# Patient Record
Sex: Male | Born: 1970 | Race: Black or African American | Hispanic: No | Marital: Married | Smoking: Never smoker
Health system: Southern US, Community
[De-identification: ages and names within clinical notes are randomized; demographics above are authoritative.]

## PROBLEM LIST (undated history)

## (undated) DIAGNOSIS — J45909 Unspecified asthma, uncomplicated: Secondary | ICD-10-CM

## (undated) DIAGNOSIS — I10 Essential (primary) hypertension: Secondary | ICD-10-CM

## (undated) DIAGNOSIS — Z8711 Personal history of peptic ulcer disease: Secondary | ICD-10-CM

## (undated) HISTORY — DX: Personal history of peptic ulcer disease: Z87.11

## (undated) HISTORY — DX: Unspecified asthma, uncomplicated: J45.909

## (undated) HISTORY — PX: HERNIA REPAIR: SHX51

---

## 2010-03-19 ENCOUNTER — Encounter: Admission: RE | Admit: 2010-03-19 | Discharge: 2010-03-19 | Payer: Self-pay | Admitting: Family Medicine

## 2010-04-18 ENCOUNTER — Ambulatory Visit (HOSPITAL_BASED_OUTPATIENT_CLINIC_OR_DEPARTMENT_OTHER): Admission: RE | Admit: 2010-04-18 | Discharge: 2010-04-18 | Payer: Self-pay | Admitting: Surgery

## 2012-05-20 HISTORY — PX: INGUINAL HERNIA REPAIR: SUR1180

## 2014-01-10 ENCOUNTER — Encounter (HOSPITAL_COMMUNITY): Payer: Self-pay | Admitting: Emergency Medicine

## 2014-01-10 ENCOUNTER — Emergency Department (HOSPITAL_COMMUNITY)
Admission: EM | Admit: 2014-01-10 | Discharge: 2014-01-10 | Disposition: A | Discharge status: Home / Self Care | Payer: Self-pay | Attending: Emergency Medicine | Admitting: Emergency Medicine

## 2014-01-10 DIAGNOSIS — L519 Erythema multiforme, unspecified: Secondary | ICD-10-CM | POA: Insufficient documentation

## 2014-01-10 DIAGNOSIS — L509 Urticaria, unspecified: Secondary | ICD-10-CM | POA: Insufficient documentation

## 2014-01-10 DIAGNOSIS — IMO0002 Reserved for concepts with insufficient information to code with codable children: Secondary | ICD-10-CM | POA: Insufficient documentation

## 2014-01-10 MED ORDER — HYDROXYZINE HCL 25 MG PO TABS: 25.0000 mg | ORAL_TABLET | Freq: Four times a day (QID) | ORAL | Status: DC

## 2014-01-10 MED ORDER — CIMETIDINE 400 MG PO TABS: 400.0000 mg | ORAL_TABLET | Freq: Two times a day (BID) | ORAL | Status: DC

## 2014-01-10 MED ORDER — PREDNISONE 20 MG PO TABS: ORAL_TABLET | ORAL | Status: DC

## 2014-01-10 MED ORDER — METHYLPREDNISOLONE ACETATE 80 MG/ML IJ SUSP
120.0000 mg | Freq: Once | INTRAMUSCULAR | Status: AC
  Administered 2014-01-10: 120 mg via INTRAMUSCULAR
  Filled 2014-01-10 (×2): qty 2

## 2014-01-10 NOTE — ED Provider Notes (Signed)
  Chief Complaint    Chief Complaint  Patient presents with  . Urticaria    History of Present Illness      Charles Dickson is a 43 year old male with a 2 day history of an itchy rash on his arms, legs, and posterior neck. These lesions are erythematous, maculopapular, and itch and hurt. He denies any rash in the trunk. There have been no new foods or medications. He denies any bites or stings. He did sleep on the couch with a friend who thinks he may have had bedbugs infesting his house. The friend's daughter have the same thing. He denies any fever or chills. There been no sore throat, headache, stiff neck, or URI symptoms. He has not been exposed to any allergens or antigens. He denies any difficulty breathing or wheezing. There is been no swelling of lips, tongue, or throat.  Review of Systems   Other than as noted above, the patient denies any of the following symptoms: Systemic:  No fever or chills. ENT:  No nasal congestion, rhinorrhea, sore throat, swelling of lips, tongue or throat. Resp:  No cough, wheezing, or shortness of breath.  [place]    Past medical history, family history, social history, meds, and allergies were reviewed.   Physical Exam     Vital signs:  BP 153/97  Pulse 103  Temp(Src) 97.7 F (36.5 C) (Oral)  Resp 14  Ht 5\' 9"  (1.753 m)  Wt 250 lb (113.399 kg)  BMI 36.90 kg/m2  SpO2 98% Gen:  Alert, oriented, in no distress. ENT:  Pharynx clear, no intraoral lesions, moist mucous membranes. Lungs:  Clear to auscultation. Skin:  He has multiple erythematous maculopapular lesions on his neck, back, arms, and legs, and some on the hands, not on the palm to the hands of souls. There were no lesions of the mucous membranes.  Course in [facility] [place]     The following meds were given:  Medications  methylPREDNISolone acetate (DEPO-MEDROL) injection 120 mg (120 mg Intramuscular Given 01/10/14 0846)   Assessment    The encounter diagnosis was Erythema  multiforme.  Plan     1.  Meds:  The following meds were prescribed:   Discharge Medication List as of 01/10/2014  8:43 AM    START taking these medications   Details  cimetidine (TAGAMET) 400 MG tablet Take 1 tablet (400 mg total) by mouth 2 (two) times daily., Starting 01/10/2014, Until Discontinued, Print    hydrOXYzine (ATARAX/VISTARIL) 25 MG tablet Take 1 tablet (25 mg total) by mouth every 6 (six) hours., Starting 01/10/2014, Until Discontinued, Print    predniSONE (DELTASONE) 20 MG tablet Take 3 daily for 5 days, 2 daily for 5 days, 1 daily for 5 days., Print        2.  Patient Education/Counseling:  The patient was given appropriate handouts, self care instructions, and instructed in symptomatic relief.   3.  Follow up:  The patient was told to follow up here if no better in 3 to 4 days, or sooner if becoming worse in any way, and given some red flag symptoms such as worsening rash, fever, or difficulty breathing which would prompt immediate return.  Follow up here if necessary.      Harden Mo, MD 01/10/14 [phone]

## 2014-01-10 NOTE — ED Notes (Signed)
Patient states "stayed at my sister's house in [place] last night.  Her daughter had bed bugs".

## 2014-01-10 NOTE — Discharge Instructions (Signed)
Hives Hives are itchy, red, swollen areas of the skin. They can vary in size and location on your body. Hives can come and go for hours or several days (acute hives) or for several weeks (chronic hives). Hives do not spread from person to person (noncontagious). They may get worse with scratching, exercise, and emotional stress. CAUSES   Allergic reaction to food, additives, or drugs.  Infections, including the common cold.  Illness, such as vasculitis, lupus, or thyroid disease.  Exposure to sunlight, heat, or cold.  Exercise.  Stress.  Contact with chemicals. SYMPTOMS   Red or white swollen patches on the skin. The patches may change size, shape, and location quickly and repeatedly.  Itching.  Swelling of the hands, feet, and face. This may occur if hives develop deeper in the skin. DIAGNOSIS  Your caregiver can usually tell what is wrong by performing a physical exam. Skin or blood tests may also be done to determine the cause of your hives. In some cases, the cause cannot be determined. TREATMENT  Mild cases usually get better with medicines such as antihistamines. Severe cases may require an emergency epinephrine injection. If the cause of your hives is known, treatment includes avoiding that trigger.  HOME CARE INSTRUCTIONS   Avoid causes that trigger your hives.  Take antihistamines as directed by your caregiver to reduce the severity of your hives. Non-sedating or low-sedating antihistamines are usually recommended. Do not drive while taking an antihistamine.  Take any other medicines prescribed for itching as directed by your caregiver.  Wear loose-fitting clothing.  Keep all follow-up appointments as directed by your caregiver. SEEK MEDICAL CARE IF:   You have persistent or severe itching that is not relieved with medicine.  You have painful or swollen joints. SEEK IMMEDIATE MEDICAL CARE IF:   You have a fever.  Your tongue or lips are swollen.  You have  trouble breathing or swallowing.  You feel tightness in the throat or chest.  You have abdominal pain. These problems may be the first sign of a life-threatening allergic reaction. Call your local emergency services (911 in [place] MAKE SURE YOU:   Understand these instructions.  Will watch your condition.  Will get help right away if you are not doing well or get worse. Document Released: 05/06/2005 Document Revised: 05/11/2013 Document Reviewed: 07/30/2011 ExitCare Patient Information 2015 [employer]. This information is not intended to replace advice given to you by your health care provider. Make sure you discuss any questions you have with your health care provider.  

## 2014-01-10 NOTE — ED Notes (Signed)
Pt reports hives to both arms and legs, back of neck since last night. They itch. Denies respiratory concerns. Is a x 4

## 2014-08-04 ENCOUNTER — Other Ambulatory Visit: Payer: Self-pay | Admitting: Surgery

## 2014-08-04 DIAGNOSIS — R1084 Generalized abdominal pain: Secondary | ICD-10-CM

## 2014-08-08 ENCOUNTER — Other Ambulatory Visit: Payer: Self-pay | Admitting: General Surgery

## 2014-08-10 ENCOUNTER — Other Ambulatory Visit: Payer: Self-pay | Admitting: General Surgery

## 2014-08-10 DIAGNOSIS — T8140XA Infection following a procedure, unspecified, initial encounter: Secondary | ICD-10-CM

## 2014-08-10 DIAGNOSIS — K9189 Other postprocedural complications and disorders of digestive system: Secondary | ICD-10-CM

## 2014-08-10 DIAGNOSIS — R1084 Generalized abdominal pain: Secondary | ICD-10-CM

## 2015-06-16 ENCOUNTER — Emergency Department (HOSPITAL_COMMUNITY): Payer: Managed Care, Other (non HMO)

## 2015-06-16 ENCOUNTER — Observation Stay (HOSPITAL_COMMUNITY)
Admission: EM | Admit: 2015-06-16 | Discharge: 2015-06-17 | Disposition: A | Discharge status: Home / Self Care | Payer: Managed Care, Other (non HMO) | Attending: Internal Medicine | Admitting: Internal Medicine

## 2015-06-16 ENCOUNTER — Encounter (HOSPITAL_COMMUNITY): Payer: Self-pay | Admitting: Emergency Medicine

## 2015-06-16 DIAGNOSIS — E669 Obesity, unspecified: Secondary | ICD-10-CM | POA: Diagnosis not present

## 2015-06-16 DIAGNOSIS — R0789 Other chest pain: Secondary | ICD-10-CM

## 2015-06-16 DIAGNOSIS — R079 Chest pain, unspecified: Secondary | ICD-10-CM | POA: Diagnosis present

## 2015-06-16 DIAGNOSIS — R002 Palpitations: Secondary | ICD-10-CM | POA: Diagnosis not present

## 2015-06-16 DIAGNOSIS — I10 Essential (primary) hypertension: Secondary | ICD-10-CM | POA: Insufficient documentation

## 2015-06-16 DIAGNOSIS — R9431 Abnormal electrocardiogram [ECG] [EKG]: Secondary | ICD-10-CM | POA: Insufficient documentation

## 2015-06-16 DIAGNOSIS — Z6838 Body mass index (BMI) 38.0-38.9, adult: Secondary | ICD-10-CM | POA: Diagnosis not present

## 2015-06-16 HISTORY — DX: Essential (primary) hypertension: I10

## 2015-06-16 MED ORDER — ASPIRIN 81 MG PO CHEW
324.0000 mg | CHEWABLE_TABLET | Freq: Once | ORAL | Status: AC
  Administered 2015-06-17: 324 mg via ORAL
  Filled 2015-06-16: qty 4

## 2015-06-16 NOTE — ED Provider Notes (Signed)
CSN: [id number]     Arrival date & time 06/16/15  2331 History   First MD Initiated Contact with Patient 06/16/15 2341     Chief Complaint  Patient presents with  . Chest Pain     (Consider location/radiation/quality/duration/timing/severity/associated sxs/prior Treatment) HPI Comments: While at work developed chest tightness that radiated to L arm lasted 45 minutes resolved with time. Dose report nausea and SOB with pain Now pain free Never has had these symptoms before, No medical history does not take nay medication  Former alcohol use never smoker  Patient is a 45 y.o. male presenting with chest pain. The history is provided by the patient.  Chest Pain Pain location:  Substernal area and L chest Pain quality: tightness   Pain radiates to:  L arm Pain radiates to the back: no   Pain severity:  Moderate Onset quality:  Sudden Duration:  45 minutes Progression:  Resolved Chronicity:  New Relieved by:  Rest Worsened by:  Nothing tried Ineffective treatments:  None tried Associated symptoms: nausea, numbness and shortness of breath   Associated symptoms: no dizziness, no fever, no headache and no palpitations   Nausea:    Severity:  Mild   Onset quality:  Sudden Shortness of breath:    Severity:  Mild   Onset quality:  Sudden   Progression:  Resolved Risk factors: male sex and obesity   Risk factors: no coronary artery disease, no diabetes mellitus, no high cholesterol, no hypertension and no smoking     Past Medical History  Diagnosis Date  . Hypertension    Past Surgical History  Procedure Laterality Date  . Hernia repair     No family history on file. Social History  Substance Use Topics  . Smoking status: Never Smoker   . Smokeless tobacco: None  . Alcohol Use: No     Comment: former    Review of Systems  Constitutional: Negative for fever and chills.  Respiratory: Positive for shortness of breath. Negative for choking.   Cardiovascular: Positive for  chest pain. Negative for palpitations and leg swelling.  Gastrointestinal: Positive for nausea.  Skin: Negative for rash.  Neurological: Positive for numbness. Negative for dizziness and headaches.  All other systems reviewed and are negative.     Allergies  Review of patient's allergies indicates no known allergies.  Home Medications   Prior to Admission medications   Medication Sig Start Date End Date Taking? Authorizing Provider  cimetidine (TAGAMET) 400 MG tablet Take 1 tablet (400 mg total) by mouth 2 (two) times daily. Patient not taking: Reported on 06/17/2015 01/10/14   Harden Mo, MD  hydrOXYzine (ATARAX/VISTARIL) 25 MG tablet Take 1 tablet (25 mg total) by mouth every 6 (six) hours. Patient not taking: Reported on 06/17/2015 01/10/14   Harden Mo, MD  predniSONE (DELTASONE) 20 MG tablet Take 3 daily for 5 days, 2 daily for 5 days, 1 daily for 5 days. Patient not taking: Reported on 06/17/2015 01/10/14   Harden Mo, MD   BP 111/76 mmHg  Pulse 66  Temp(Src) 97.8 F (36.6 C) (Oral)  Resp 16  Ht 5\' 9"  (1.753 m)  Wt 117.935 kg  BMI 38.38 kg/m2  SpO2 100% Physical Exam  Constitutional: He is oriented to person, place, and time. He appears well-developed and well-nourished. No distress.  HENT:  Head: Normocephalic.  Eyes: Pupils are equal, round, and reactive to light.  Neck: Normal range of motion.  Cardiovascular: Normal rate and regular rhythm.  Pulmonary/Chest: Effort normal and breath sounds normal.  Musculoskeletal: Normal range of motion.  Neurological: He is alert and oriented to person, place, and time.  Skin: Skin is warm and dry. He is not diaphoretic.  Nursing note and vitals reviewed.   ED Course  Procedures (including critical care time) Labs Review Labs Reviewed  CBC WITH DIFFERENTIAL/PLATELET - Abnormal; Notable for the following:    MCV 76.0 (*)    All other components within normal limits  I-STAT CHEM 8, ED - Abnormal; Notable for the  following:    Glucose, Bld 100 (*)    All other components within normal limits  TROPONIN I  TROPONIN I  TROPONIN I  I-STAT TROPOININ, ED    Imaging Review Dg Chest 2 View  06/17/2015  CLINICAL DATA:  Acute onset of left-sided chest pain. Initial encounter. EXAM: CHEST  2 VIEW COMPARISON:  None. FINDINGS: The lungs are well-aerated. Pulmonary vascularity is at the upper limits of normal. There is no evidence of focal opacification, pleural effusion or pneumothorax. The heart is normal in size; the mediastinal contour is within normal limits. No acute osseous abnormalities are seen. IMPRESSION: No acute cardiopulmonary process seen. Electronically Signed   By: Garald Balding M.D.   On: 06/17/2015 00:46   I have personally reviewed and evaluated these images and lab results as part of my medical decision-making.   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

## 2015-06-16 NOTE — ED Notes (Signed)
Pt states about an hour ago he was at work and started having pain in his left chest  Pt states the pain radiated down the left arm  Pt states with the pain he had some sweating and shortness of breath

## 2015-06-17 ENCOUNTER — Other Ambulatory Visit: Payer: Self-pay | Admitting: Internal Medicine

## 2015-06-17 ENCOUNTER — Emergency Department (HOSPITAL_COMMUNITY): Payer: Managed Care, Other (non HMO)

## 2015-06-17 ENCOUNTER — Encounter (HOSPITAL_COMMUNITY): Payer: Self-pay | Admitting: Internal Medicine

## 2015-06-17 DIAGNOSIS — R072 Precordial pain: Secondary | ICD-10-CM

## 2015-06-17 DIAGNOSIS — I10 Essential (primary) hypertension: Secondary | ICD-10-CM | POA: Diagnosis present

## 2015-06-17 DIAGNOSIS — R079 Chest pain, unspecified: Secondary | ICD-10-CM | POA: Diagnosis not present

## 2015-06-17 DIAGNOSIS — R002 Palpitations: Secondary | ICD-10-CM | POA: Diagnosis not present

## 2015-06-17 DIAGNOSIS — R9431 Abnormal electrocardiogram [ECG] [EKG]: Secondary | ICD-10-CM

## 2015-06-17 LAB — I-STAT CHEM 8, ED
BUN: 17 mg/dL (ref 6–20)
Calcium, Ion: 1.19 mmol/L (ref 1.12–1.23)
Chloride: 103 mmol/L (ref 101–111)
Creatinine, Ser: 0.8 mg/dL (ref 0.61–1.24)
Glucose, Bld: 100 mg/dL — ABNORMAL HIGH (ref 65–99)
HEMATOCRIT: 45 % (ref 39.0–52.0)
HEMOGLOBIN: 15.3 g/dL (ref 13.0–17.0)
Potassium: 4.2 mmol/L (ref 3.5–5.1)
SODIUM: 141 mmol/L (ref 135–145)
TCO2: 26 mmol/L (ref 0–100)

## 2015-06-17 LAB — CBC WITH DIFFERENTIAL/PLATELET
BASOS ABS: 0 10*3/uL (ref 0.0–0.1)
BASOS PCT: 0 %
EOS ABS: 0.2 10*3/uL (ref 0.0–0.7)
Eosinophils Relative: 3 %
HCT: 40.2 % (ref 39.0–52.0)
Hemoglobin: 13.9 g/dL (ref 13.0–17.0)
Lymphocytes Relative: 44 %
Lymphs Abs: 3 10*3/uL (ref 0.7–4.0)
MCH: 26.3 pg (ref 26.0–34.0)
MCHC: 34.6 g/dL (ref 30.0–36.0)
MCV: 76 fL — ABNORMAL LOW (ref 78.0–100.0)
MONO ABS: 0.5 10*3/uL (ref 0.1–1.0)
MONOS PCT: 7 %
NEUTROS PCT: 46 %
Neutro Abs: 3.2 10*3/uL (ref 1.7–7.7)
Platelets: 255 10*3/uL (ref 150–400)
RBC: 5.29 MIL/uL (ref 4.22–5.81)
RDW: 13.1 % (ref 11.5–15.5)
WBC: 6.9 10*3/uL (ref 4.0–10.5)

## 2015-06-17 LAB — TROPONIN I
Troponin I: <0.03 ng/mL (ref <0.031)
Troponin I: <0.03 ng/mL (ref <0.031)

## 2015-06-17 LAB — I-STAT TROPONIN, ED: TROPONIN I, POC: 0 ng/mL (ref 0.00–0.08)

## 2015-06-17 LAB — TSH: TSH: 0.804 u[IU]/mL (ref 0.350–4.500)

## 2015-06-17 MED ORDER — NITROGLYCERIN 0.4 MG SL SUBL
0.4000 mg | SUBLINGUAL_TABLET | SUBLINGUAL | Status: DC | PRN
  Administered 2015-06-17: 0.4 mg via SUBLINGUAL
  Filled 2015-06-17: qty 1

## 2015-06-17 MED ORDER — ONDANSETRON HCL 4 MG/2ML IJ SOLN: 4.0000 mg | Freq: Four times a day (QID) | INTRAMUSCULAR | Status: DC | PRN

## 2015-06-17 MED ORDER — ASPIRIN EC 81 MG PO TBEC: 81.0000 mg | DELAYED_RELEASE_TABLET | Freq: Every day | ORAL | Status: DC

## 2015-06-17 MED ORDER — ENOXAPARIN SODIUM 40 MG/0.4ML ~~LOC~~ SOLN
40.0000 mg | SUBCUTANEOUS | Status: DC
  Filled 2015-06-17: qty 0.4

## 2015-06-17 MED ORDER — ACETAMINOPHEN 325 MG PO TABS: 650.0000 mg | ORAL_TABLET | ORAL | Status: DC | PRN

## 2015-06-17 NOTE — Discharge Summary (Signed)
[facility]  Physician Discharge Summary   Patient ID: Charles Dickson MRN: [id number] DOB/AGE: 47-18-1972 47 y.o.  Admit date: 06/16/2015 Discharge date: 06/17/2015  PCP: No PCP Per Patient  DISCHARGE DIAGNOSES:  Principal Problem:   Chest pain with moderate risk for cardiac etiology Active Problems:   Rapid palpitations   HTN (hypertension)   RECOMMENDATIONS FOR OUTPATIENT FOLLOW UP: 1. Cardiology to arrange outpatient echocardiogram and stress test and possible event monitor 2. 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 He got diaphoretic. He was given nitroglycerin with resolution of his symptoms. EKG was noted to be abnormal. Patient was hospitalized for further management.  Consultations:  Cardiology  Procedures:  None  HOSPITAL COURSE:   Left-sided chest pain Etiology is unclear. Patient's symptoms resolved with nitroglycerin. Troponins are normal. EKG does show abnormalities, however, not suggestive of ischemia. Patient is physically active and works out on the treadmill 4 times a week. His resting heart rate is in the 50s. Patient reports palpitations even prior to this episode of chest pain. They could be some arrhythmia. Patient seen by cardiology. Workup can be pursued as outpatient including stress test, echocardiogram and event monitor. Lipid panel will also be checked as outpatient. TSH is normal. Patient reports taking testosterone obtained over-the-counter. He mentions that he takes this medication since he is over the age of 45. Hasn't been diagnosed with Testosterone deficiency. Also used to take weight  loss medications. Have told him to stop using any nonprescription medications. He will be sent home with baby aspirin. He used to drink alcohol quite heavily up until July. I have encouraged him to stay off of alcohol. He will be given a note for work. He will need to stay out of work until he has been cleared by cardiology.  There is a report of a history of hypertension. However, his blood pressures here have been very well controlled.  Discussed in detail with the patient and his wife. Stable for discharge home today. He needs to come back to the hospital if his symptoms recur. Otherwise, further testing to be pursued as outpatient.  PERTINENT LABS:  The results of significant diagnostics from this hospitalization (including imaging, microbiology, ancillary and laboratory) are listed below for reference.      Labs: Basic Metabolic Panel:  Recent Labs Lab 06/17/15 0005  NA 141  K 4.2  CL 103  GLUCOSE 100*  BUN 17  CREATININE 0.80   CBC:  Recent Labs Lab 06/16/15 2359 06/17/15 0005  WBC 6.9  --   NEUTROABS 3.2  --   HGB 13.9 15.3  HCT 40.2 45.0  MCV 76.0*  --   PLT 255  --    Cardiac Enzymes:  Recent Labs Lab 06/17/15 0220 06/17/15 0535 06/17/15 0835  TROPONINI <0.03 <0.03 <0.03     IMAGING STUDIES Dg Chest 2 View  06/17/2015  CLINICAL DATA:  Acute onset of left-sided chest pain. Initial encounter. EXAM: CHEST  2 VIEW COMPARISON:  None. FINDINGS: The lungs are well-aerated. Pulmonary vascularity is at the upper limits of normal. There is no evidence of focal opacification, pleural effusion or pneumothorax. The heart is  normal in size; the mediastinal contour is within normal limits. No acute osseous abnormalities are seen. IMPRESSION: No acute cardiopulmonary process seen. Electronically Signed   By: Garald Balding M.D.   On: 06/17/2015 00:46    DISCHARGE EXAMINATION: Filed Vitals:   06/17/15 0200 06/17/15 0215 06/17/15 0300 06/17/15 0608  BP: 111/76  123/71  123/81  Pulse: 63 66 58 58  Temp:   97.8 F (36.6 C) 97.9 F (36.6 C)  TempSrc:   Oral Oral  Resp: 20 16 18 18   Height:   5\' 9"  (1.753 m)   Weight:   117.4 kg (258 lb 13.1 oz)   SpO2: 100% 100% 100% 100%   General appearance: alert, cooperative, appears stated age and no distress Resp: clear to auscultation bilaterally Cardio: regular rate and rhythm, S1, S2 normal, no murmur, click, rub or gallop GI: soft, non-tender; bowel sounds normal; no masses,  no organomegaly Extremities: extremities normal, atraumatic, no cyanosis or edema  DISPOSITION: Home with wife  Discharge Instructions    Call MD for:  difficulty breathing, headache or visual disturbances    Complete by:  As directed      Call MD for:  extreme fatigue    Complete by:  As directed      Call MD for:  persistant dizziness or light-headedness    Complete by:  As directed      Call MD for:  persistant nausea and vomiting    Complete by:  As directed      Call MD for:  severe uncontrolled pain    Complete by:  As directed      Call MD for:  temperature >100.4    Complete by:  As directed      Diet - low sodium heart healthy    Complete by:  As directed      Discharge instructions    Complete by:  As directed   Dr. Debara Pickett will schedule outpatient stress test, ECHOCARDIOGRAM and will check your lipid levels. Your thyroid level will be drawn before you leave and results will be provided by Dr. Debara Pickett. Please stop all other medications except for the aspirin. Please refrain from exercise till you have had the stress test. Please avoid alcohol use.  You were cared for by a hospitalist during your hospital stay. If you have any questions about your discharge medications or the care you received while you were in the hospital after you are discharged, you can call the unit and asked to speak with the hospitalist on call if the hospitalist that took care of you is not available. Once you are discharged, your primary care physician  will handle any further medical issues. Please note that NO REFILLS for any discharge medications will be authorized once you are discharged, as it is imperative that you return to your primary care physician (or establish a relationship with a primary care physician if you do not have one) for your aftercare needs so that they can reassess your need for medications and monitor your lab values. If you do not have a primary care physician, you can call [phone] for a physician referral.     Increase activity slowly    Complete by:  As directed            ALLERGIES: No Known Allergies   Discharge Medication List as of 06/17/2015  9:50 AM    START taking these medications   Details  aspirin EC 81 MG tablet Take 1 tablet (  81 mg total) by mouth daily., Starting 06/17/2015, Until Discontinued, Print      STOP taking these medications     cimetidine (TAGAMET) 400 MG tablet      hydrOXYzine (ATARAX/VISTARIL) 25 MG tablet      predniSONE (DELTASONE) 20 MG tablet          TOTAL DISCHARGE TIME: 35 minutes  [facility]  [facility] Pager [phone]  06/17/2015, 11:44 AM

## 2015-06-17 NOTE — Consult Note (Signed)
CONSULTATION NOTE  Reason for Consult: Chest pain  Requesting Physician: Dr. Maryland Pink  Cardiologist: None (NEW)  HPI: This is a 45 y.o. male with a past medical history significant only for hypertension. 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 Yesterday, he was decorating for his daughter's birthday today and was reaching for a balloon when he had chest pain which was sharp and brief. He said it eased off and he dismissed it. Last night, he was moving some food at the warehouse and started to feel a chest tightness, his heart racing and his vision graying out - associated with anxiety and pressure that radiated to his left arm. He sat down and the symptoms slowly improved, but not completely. He eventually had resolution of his symptoms when coming to the ER. He was given aspirin. He is asymptomatic at this time. EKG is impressive for RSr' in V1-V2 and 2 mm ST elevation in V1,V2 with upright T-waves - DDX includes RVH or conduction delay, pulmonary disease, or less likely Type 2 brugada syndrome. He reports he has recently been using dietary weight loss supplements and testosterone replacement OTC.  PMHx:  Past Medical History  Diagnosis Date  . Hypertension    Past Surgical History  Procedure Laterality Date  . Hernia repair      FAMHx: Family History  Problem Relation Age of Onset  . Hypertension Mother   . Alcohol abuse Father     SOCHx:  reports that he has never smoked. He does not have any smokeless tobacco history on file. He reports that he does not drink alcohol or use illicit drugs.  ALLERGIES: No Known Allergies  ROS: Pertinent items noted in HPI and remainder of comprehensive ROS otherwise negative.  HOME MEDICATIONS:   Medication List    ASK your doctor about these medications        cimetidine 400 MG tablet  Commonly known as:  TAGAMET  Take 1 tablet (400 mg total) by mouth 2 (two) times daily.     hydrOXYzine 25  MG tablet  Commonly known as:  ATARAX/VISTARIL  Take 1 tablet (25 mg total) by mouth every 6 (six) hours.     predniSONE 20 MG tablet  Commonly known as:  DELTASONE  Take 3 daily for 5 days, 2 daily for 5 days, 1 daily for 5 days.        HOSPITAL MEDICATIONS: I have reviewed the patient's current medications.  VITALS: Blood pressure 123/81, pulse 58, temperature 97.9 F (36.6 C), temperature source Oral, resp. rate 18, height 5\' 9"  (1.753 m), weight 258 lb 13.1 oz (117.4 kg), SpO2 100 %.  PHYSICAL EXAM: General appearance: alert and no distress Neck: no carotid bruit and no JVD Lungs: clear to auscultation bilaterally Heart: regular rate and rhythm and S1, S2 normal Abdomen: soft, non-tender; bowel sounds normal; no masses,  no organomegaly Extremities: extremities normal, atraumatic, no cyanosis or edema Pulses: 2+ and symmetric Skin: Skin color, texture, turgor normal. No rashes or lesions Neurologic: Grossly normal Psych: Pleasant  LABS: Results for orders placed or performed during the hospital encounter of 06/16/15 (from the past 48 hour(s))  CBC with Differential     Status: Abnormal   Collection Time: 06/16/15 11:59 PM  Result Value Ref Range   WBC 6.9 4.0 - 10.5 K/uL   RBC 5.29 4.22 - 5.81 MIL/uL   Hemoglobin 13.9 13.0 - 17.0 g/dL   HCT 40.2 39.0 - 52.0 %  MCV 76.0 (L) 78.0 - 100.0 fL   MCH 26.3 26.0 - 34.0 pg   MCHC 34.6 30.0 - 36.0 g/dL   RDW 13.1 11.5 - 15.5 %   Platelets 255 150 - 400 K/uL   Neutrophils Relative % 46 %   Neutro Abs 3.2 1.7 - 7.7 K/uL   Lymphocytes Relative 44 %   Lymphs Abs 3.0 0.7 - 4.0 K/uL   Monocytes Relative 7 %   Monocytes Absolute 0.5 0.1 - 1.0 K/uL   Eosinophils Relative 3 %   Eosinophils Absolute 0.2 0.0 - 0.7 K/uL   Basophils Relative 0 %   Basophils Absolute 0.0 0.0 - 0.1 K/uL  I-stat troponin, ED     Status: None   Collection Time: 06/17/15 12:03 AM  Result Value Ref Range   Troponin i, poc 0.00 0.00 - 0.08 ng/mL    Comment 3            Comment: Due to the release kinetics of cTnI, a negative result within the first hours of the onset of symptoms does not rule out myocardial infarction with certainty. If myocardial infarction is still suspected, repeat the test at appropriate intervals.   I-stat chem 8, ed     Status: Abnormal   Collection Time: 06/17/15 12:05 AM  Result Value Ref Range   Sodium 141 135 - 145 mmol/L   Potassium 4.2 3.5 - 5.1 mmol/L   Chloride 103 101 - 111 mmol/L   BUN 17 6 - 20 mg/dL   Creatinine, Ser 0.80 0.61 - 1.24 mg/dL   Glucose, Bld 100 (H) 65 - 99 mg/dL   Calcium, Ion 1.19 1.12 - 1.23 mmol/L   TCO2 26 0 - 100 mmol/L   Hemoglobin 15.3 13.0 - 17.0 g/dL   HCT 45.0 39.0 - 52.0 %  Troponin I-serum (0, 3, 6 hours)     Status: None   Collection Time: 06/17/15  2:20 AM  Result Value Ref Range   Troponin I <0.03 <0.031 ng/mL    Comment:        NO INDICATION OF MYOCARDIAL INJURY.   Troponin I-serum (0, 3, 6 hours)     Status: None   Collection Time: 06/17/15  5:35 AM  Result Value Ref Range   Troponin I <0.03 <0.031 ng/mL    Comment:        NO INDICATION OF MYOCARDIAL INJURY.   Troponin I-serum (0, 3, 6 hours)     Status: None   Collection Time: 06/17/15  8:35 AM  Result Value Ref Range   Troponin I <0.03 <0.031 ng/mL    Comment:        NO INDICATION OF MYOCARDIAL INJURY.     IMAGING: Dg Chest 2 View  06/17/2015  CLINICAL DATA:  Acute onset of left-sided chest pain. Initial encounter. EXAM: CHEST  2 VIEW COMPARISON:  None. FINDINGS: The lungs are well-aerated. Pulmonary vascularity is at the upper limits of normal. There is no evidence of focal opacification, pleural effusion or pneumothorax. The heart is normal in size; the mediastinal contour is within normal limits. No acute osseous abnormalities are seen. IMPRESSION: No acute cardiopulmonary process seen. Electronically Signed   By: Garald Balding M.D.   On: 06/17/2015 00:46    HOSPITAL DIAGNOSES: Principal  Problem:   Chest pain with moderate risk for cardiac etiology Active Problems:   Rapid palpitations   HTN (hypertension)   IMPRESSION: 1. Chest pain, likely secondary to tachypalpitations 2. Abnormal EKG - possible conduction system  disease or RVH 3. Hypertension  RECOMMENDATION: 1. Mr. Swirsky has ruled-out for acute MI. He has recently been having short-lived palpitations, but this episode was more sustained - his heart was racing and it caused chest tightness and left arm numbness. This resolved spontaneously. Cardiac enzymes are negative and ACS is considered less likely. Reasonable to take low dose aspirin until an ischemia work-up is ordered. I would recommend an outpatient echocardiogram given chest pain and abnormal EKG findings. He should also have a treadmill exercise stress test and be fitted with a 2 week event monitor.  I'm happy to see him in follow-up after that.  We will need to check an outpatient lipid profile as well. TSH is pending and I will follow-up on that.  Thanks for the consultation.  Time Spent Directly with Patient: 45 minutes  Pixie Casino, MD, [facility] Attending Cardiologist [place] 06/17/2015, 9:36 AM

## 2015-06-17 NOTE — H&P (Signed)
[facility] History and Physical  Charles Dickson [id number] DOB: 1970/10/29 DOA: 06/16/2015  Referring physician: EDP PCP: No PCP Per Patient   Chief Complaint: Chest pain   HPI: Charles Dickson is a 45 y.o. male who presents to the ED with c/o left sided chest pain.  Pain abruptly onset with radiation to L shoulder, arm, and jaw.  Occurred 1 hr PTA and lasted about 45 mins before completely resolving.  Review of Systems: Systems reviewed.  As above, otherwise negative  Past Medical History  Diagnosis Date  . Hypertension    Past Surgical History  Procedure Laterality Date  . Hernia repair     Social History:  reports that he has never smoked. He does not have any smokeless tobacco history on file. He reports that he does not drink alcohol or use illicit drugs.  No Known Allergies  No family history on file. No history of early onset CAD, MI, cardiac disease, etc.  Prior to Admission medications   Medication Sig Start Date End Date Taking? Authorizing Provider  cimetidine (TAGAMET) 400 MG tablet Take 1 tablet (400 mg total) by mouth 2 (two) times daily. Patient not taking: Reported on 06/17/2015 01/10/14   Harden Mo, MD  hydrOXYzine (ATARAX/VISTARIL) 25 MG tablet Take 1 tablet (25 mg total) by mouth every 6 (six) hours. Patient not taking: Reported on 06/17/2015 01/10/14   Harden Mo, MD  predniSONE (DELTASONE) 20 MG tablet Take 3 daily for 5 days, 2 daily for 5 days, 1 daily for 5 days. Patient not taking: Reported on 06/17/2015 01/10/14   Harden Mo, MD   Physical Exam: Filed Vitals:   06/17/15 0138 06/17/15 0200  BP: 108/77 111/76  Pulse: 70 63  Temp:    Resp: 18 20    BP 111/76 mmHg  Pulse 63  Temp(Src) 97.8 F (36.6 C) (Oral)  Resp 20  Ht 5\' 9"  (1.753 m)  Wt 117.935 kg (260 lb)  BMI 38.38 kg/m2  SpO2 100%  General Appearance:    Alert, oriented, no distress, appears stated age  Head:    Normocephalic, atraumatic  Eyes:    PERRL, EOMI, sclera  non-icteric        Nose:   Nares without drainage or epistaxis. Mucosa, turbinates normal  Throat:   Moist mucous membranes. Oropharynx without erythema or exudate.  Neck:   Supple. No carotid bruits.  No thyromegaly.  No lymphadenopathy.   Back:     No CVA tenderness, no spinal tenderness  Lungs:     Clear to auscultation bilaterally, without wheezes, rhonchi or rales  Chest wall:    No tenderness to palpitation  Heart:    Regular rate and rhythm without murmurs, gallops, rubs  Abdomen:     Soft, non-tender, nondistended, normal bowel sounds, no organomegaly  Genitalia:    deferred  Rectal:    deferred  Extremities:   No clubbing, cyanosis or edema.  Pulses:   2+ and symmetric all extremities  Skin:   Skin color, texture, turgor normal, no rashes or lesions  Lymph nodes:   Cervical, supraclavicular, and axillary nodes normal  Neurologic:   CNII-XII intact. Normal strength, sensation and reflexes      throughout    Labs on Admission:  Basic Metabolic Panel:  Recent Labs Lab 06/17/15 0005  NA 141  K 4.2  CL 103  GLUCOSE 100*  BUN 17  CREATININE 0.80   Liver Function Tests: No results for input(s): AST, ALT, ALKPHOS, BILITOT,  PROT, ALBUMIN in the last 168 hours. No results for input(s): LIPASE, AMYLASE in the last 168 hours. No results for input(s): AMMONIA in the last 168 hours. CBC:  Recent Labs Lab 06/16/15 2359 06/17/15 0005  WBC 6.9  --   NEUTROABS 3.2  --   HGB 13.9 15.3  HCT 40.2 45.0  MCV 76.0*  --   PLT 255  --    Cardiac Enzymes: No results for input(s): CKTOTAL, CKMB, CKMBINDEX, TROPONINI in the last 168 hours.  BNP (last 3 results) No results for input(s): PROBNP in the last 8760 hours. CBG: No results for input(s): GLUCAP in the last 168 hours.  Radiological Exams on Admission: Dg Chest 2 View  06/17/2015  CLINICAL DATA:  Acute onset of left-sided chest pain. Initial encounter. EXAM: CHEST  2 VIEW COMPARISON:  None. FINDINGS: The lungs are  well-aerated. Pulmonary vascularity is at the upper limits of normal. There is no evidence of focal opacification, pleural effusion or pneumothorax. The heart is normal in size; the mediastinal contour is within normal limits. No acute osseous abnormalities are seen. IMPRESSION: No acute cardiopulmonary process seen. Electronically Signed   By: Garald Balding M.D.   On: 06/17/2015 00:46    EKG: Independently reviewed.  Assessment/Plan Principal Problem:   Chest pain with moderate risk for cardiac etiology   1. Chest pain with moderate risk - HEART score of 4 1. Chest pain obs 2. Serial trops 3. Tele monitor 4. NPO 2. S1QT3 - non-specific EKG changes, no tachycardia, no recent travel, PERC score is negative, PE is unlikely.   Code Status: Full  Family Communication: No family in room Disposition Plan: Admit to obs   Time spent: 50 min  Tamarick Kovalcik M. [facility] Pager [phone]  If 7AM-7PM, please contact the day team taking care of the patient [email or web address] Password [email or web address] 06/17/2015, 2:13 AM

## 2015-06-17 NOTE — Discharge Instructions (Signed)

## 2015-06-27 ENCOUNTER — Encounter: Payer: Managed Care, Other (non HMO) | Admitting: Nurse Practitioner

## 2015-07-07 ENCOUNTER — Encounter: Payer: Self-pay | Admitting: Nurse Practitioner

## 2017-07-17 ENCOUNTER — Emergency Department (HOSPITAL_BASED_OUTPATIENT_CLINIC_OR_DEPARTMENT_OTHER)
Admit: 2017-07-17 | Discharge: 2017-07-17 | Disposition: A | Discharge status: Home / Self Care | Payer: Self-pay | Attending: Emergency Medicine | Admitting: Emergency Medicine

## 2017-07-17 ENCOUNTER — Emergency Department (HOSPITAL_COMMUNITY)
Admission: EM | Admit: 2017-07-17 | Discharge: 2017-07-17 | Disposition: A | Discharge status: Home / Self Care | Payer: Self-pay | Attending: Emergency Medicine | Admitting: Emergency Medicine

## 2017-07-17 ENCOUNTER — Encounter (HOSPITAL_COMMUNITY): Payer: Self-pay

## 2017-07-17 DIAGNOSIS — M79605 Pain in left leg: Secondary | ICD-10-CM

## 2017-07-17 DIAGNOSIS — M25552 Pain in left hip: Secondary | ICD-10-CM | POA: Insufficient documentation

## 2017-07-17 DIAGNOSIS — Z7982 Long term (current) use of aspirin: Secondary | ICD-10-CM | POA: Insufficient documentation

## 2017-07-17 DIAGNOSIS — M79609 Pain in unspecified limb: Secondary | ICD-10-CM

## 2017-07-17 DIAGNOSIS — I10 Essential (primary) hypertension: Secondary | ICD-10-CM | POA: Insufficient documentation

## 2017-07-17 MED ORDER — NAPROXEN 500 MG PO TABS
500.0000 mg | ORAL_TABLET | Freq: Once | ORAL | Status: AC
  Administered 2017-07-17: 500 mg via ORAL
  Filled 2017-07-17: qty 1

## 2017-07-17 MED ORDER — METHOCARBAMOL 500 MG PO TABS: 500.0000 mg | ORAL_TABLET | Freq: Three times a day (TID) | ORAL | 0 refills | Status: DC | PRN

## 2017-07-17 MED ORDER — NAPROXEN 500 MG PO TABS: 500.0000 mg | ORAL_TABLET | Freq: Two times a day (BID) | ORAL | 0 refills | Status: DC

## 2017-07-17 NOTE — Discharge Instructions (Signed)
Your were seen in the emergency department today for pain in your left leg.  The ultrasound and other tests performed did not show signs of a blood clot in your veins or abnormalities with your blood flow through your arteries.  We are unsure of the exact cause of your discomfort.  We have given you 2 prescriptions to treat your pain: Naproxen is a nonsteroidal anti-inflammatory medication that will help with pain and swelling. Be sure to take this medication as prescribed with food, 1 pill every 12 hours,  It should be taken with food, as it can cause stomach upset, and more seriously, stomach bleeding. Do not take other nonsteroidal anti-inflammatory medications with this such as Advil, Motrin, or Aleve.   Robaxin is the muscle relaxer I have prescribed, this is meant to help with muscle tightness. Be aware that this medication may make you drowsy therefore the first time you take this it should be at a time you are in an environment where you can rest. Do not drive or operate heavy machinery when taking this medication.   If your symptoms are not improved within the next 1 week follow-up with your primary care doctor for reevaluation.  If you do not have a primary care doctor you may follow-up with our [place] [facility] or call the phone number circled in your discharge instructions to establish care somewhere.  Return to the emergency department for any new or worsening symptoms or any other concerns you may have.

## 2017-07-17 NOTE — ED Triage Notes (Signed)
Pt complains of left leg pain for one month, he states that when he stands on it it feels like the circulation is being cut off and leg becomes tight

## 2017-07-17 NOTE — ED Provider Notes (Signed)
[place] DEPT Provider Note   CSN: [id number] Arrival date & time: 07/17/17  0524     History   Chief Complaint Chief Complaint  Patient presents with  . Leg Pain    HPI Charles Dickson is a 47 y.o. male with a hx of HTN who presents to the ED due to concern for proximal LLE pain that has been progressively worsening x 1 week. Patient states he has been having L hip pain for the past 1 month which he attributed to arthritis, however became concerned 1 week ago when the proximal leg started to hurt. States that the pain is located in the thigh region, more prominently anteriorly. States it feels like a tightness and as if "almost feels like cut off circulation." States pain at present is a 7/10 in severity, at worst it gets to a 10/10 in severity. Worse with ambulating/being on his feet all day, better with rest. States he and his wife feel there has been intermittent swelling to the leg. Denies paresthesias, numbness, fever, or chills. No recent change in activity or injury.  HPI  Past Medical History:  Diagnosis Date  . Hypertension     Patient Active Problem List   Diagnosis Date Noted  . Chest pain with moderate risk for cardiac etiology 06/17/2015  . Rapid palpitations 06/17/2015  . HTN (hypertension) 06/17/2015    Past Surgical History:  Procedure Laterality Date  . HERNIA REPAIR         Home Medications    Prior to Admission medications   Medication Sig Start Date End Date Taking? Authorizing Provider  aspirin EC 81 MG tablet Take 1 tablet (81 mg total) by mouth daily. 06/17/15   Bonnielee Haff, MD    Family History Family History  Problem Relation Age of Onset  . Hypertension Mother   . Alcohol abuse Father     Social History Social History   Tobacco Use  . Smoking status: Never Smoker  . Smokeless tobacco: Never Used  Substance Use Topics  . Alcohol use: No    Comment: former  . Drug use: No     Allergies     Patient has no known allergies.   Review of Systems Review of Systems  Constitutional: Negative for chills and fever.  Cardiovascular: Positive for leg swelling (intermittent L upper leg).  Musculoskeletal: Positive for arthralgias (L hip) and myalgias (L upper leg). Negative for back pain.  Neurological: Negative for weakness and numbness.    Physical Exam Updated Vital Signs BP 137/87 (BP Location: Right Arm)   Pulse 66   Temp 98.1 F (36.7 C) (Oral)   Resp 16   Ht 5\' 9"  (1.753 m)   Wt 117.9 kg (260 lb)   SpO2 98%   BMI 38.40 kg/m   Physical Exam  Constitutional: He appears well-developed and well-nourished. No distress.  HENT:  Head: Normocephalic and atraumatic.  Eyes: Conjunctivae are normal. Right eye exhibits no discharge. Left eye exhibits no discharge.  Cardiovascular: Normal rate and regular rhythm.  No murmur heard. Pulses:      Dorsalis pedis pulses are 2+ on the right side, and 2+ on the left side.       Posterior tibial pulses are 2+ on the right side, and 2+ on the left side.  Pulmonary/Chest: Effort normal and breath sounds normal. No respiratory distress.  Musculoskeletal: He exhibits no edema.  Back: No midline tenderness or paraspinal muscle tenderness to palpation Lower extremities: No obvious deformity,  erythema, ecchymosis, or appreciable swelling.  No edema noted.  Patient has full range of motion at all joints.  He has some discomfort in the anterior thigh of the left lower extremity with knee flexion.  Lower extremities are completely nontender.  Neurological: He is alert.  Clear speech. 5/5 strength with ankle plantar/dorsiflexion bilaterally and knee flexion/extension. Sensation grossly intact to bilateral lower extremities. Gait is intact.   Skin: Capillary refill takes less than 2 seconds.  Psychiatric: He has a normal mood and affect. His behavior is normal. Thought content normal.  Nursing note and vitals reviewed.   ED Treatments /  Results  Labs (all labs ordered are listed, but only abnormal results are displayed) Labs Reviewed - No data to display  EKG  EKG Interpretation None      Radiology No results found.  Procedures Procedures (including critical care time)  Medications Ordered in ED Medications  naproxen (NAPROSYN) tablet 500 mg (500 mg Oral Given 07/17/17 1016)   Initial Impression / Assessment and Plan / ED Course  I have reviewed the triage vital signs and the nursing notes.  Pertinent labs & imaging results that were available during my care of the patient were reviewed by me and considered in my medical decision making (see chart for details).   Patient presents with left lower extremity pain.  Patient is nontoxic-appearing, no apparent distress, vitals are WNL. Discussed history and physical exam with supervising physician Dr. Myrene Buddy- recommended venous US and arterial US w/ ABIs.   08:03: CONSULT: Discussed case with radiology- will perform venous US and ABIs, will perform arterial US only if abnormal ABIs. In agreement with this.   Ultrasound negative for DVT, ABIs within normal limits. Patient's back and LEs are nontender to palpation with good range of motion, NVI distally, doubt fracture or dislocation.  No clear etiology of patient's discomfort, suspicion for muscle etiology due to pain to anterior thigh with knee flexion.  Will treat with Naproxen and Robaxin-patient aware that he may not drive or operate heavy machinery when taking Robaxin. Discussed stretching and rest from gym routine. I discussed results, treatment plan, need for PCP follow-up, and return precautions with the patient. Provided opportunity for questions, patient confirmed understanding and is in agreement with plan.   Final Clinical Impressions(s) / ED Diagnoses   Final diagnoses:  Left leg pain    ED Discharge Orders        Ordered    naproxen (NAPROSYN) 500 MG tablet  2 times daily     07/17/17 1005     methocarbamol (ROBAXIN) 500 MG tablet  Every 8 hours PRN     07/17/17 1005       Laruen Risser, [place] R, PA-C 07/17/17 1041    Duffy Bruce, MD 07/18/17 1106

## 2017-07-17 NOTE — Progress Notes (Signed)
Left lower extremity venous duplex has been completed. Negative for DVT.  ABI's have been completed. Right 1.22 Left 1.18  Results were given to [facility] PA.  07/17/17 10:15 AM Charles Dickson RVT

## 2021-05-08 ENCOUNTER — Ambulatory Visit: Payer: BC Managed Care – PPO | Admitting: Emergency Medicine

## 2021-05-08 ENCOUNTER — Other Ambulatory Visit: Payer: Self-pay

## 2021-05-08 ENCOUNTER — Encounter: Payer: Self-pay | Admitting: Emergency Medicine

## 2021-05-08 VITALS — BP 118/70 | HR 67 | Temp 98.5°F | Ht 69.0 in | Wt 268.0 lb

## 2021-05-08 DIAGNOSIS — Z130 Encounter for screening for diseases of the blood and blood-forming organs and certain disorders involving the immune mechanism: Secondary | ICD-10-CM | POA: Diagnosis not present

## 2021-05-08 DIAGNOSIS — Z125 Encounter for screening for malignant neoplasm of prostate: Secondary | ICD-10-CM | POA: Diagnosis not present

## 2021-05-08 DIAGNOSIS — Z114 Encounter for screening for human immunodeficiency virus [HIV]: Secondary | ICD-10-CM | POA: Diagnosis not present

## 2021-05-08 DIAGNOSIS — Z0000 Encounter for general adult medical examination without abnormal findings: Secondary | ICD-10-CM

## 2021-05-08 DIAGNOSIS — Z13220 Encounter for screening for lipoid disorders: Secondary | ICD-10-CM

## 2021-05-08 DIAGNOSIS — Z13228 Encounter for screening for other metabolic disorders: Secondary | ICD-10-CM | POA: Diagnosis not present

## 2021-05-08 DIAGNOSIS — Z1329 Encounter for screening for other suspected endocrine disorder: Secondary | ICD-10-CM | POA: Diagnosis not present

## 2021-05-08 DIAGNOSIS — Z1211 Encounter for screening for malignant neoplasm of colon: Secondary | ICD-10-CM

## 2021-05-08 DIAGNOSIS — Z7689 Persons encountering health services in other specified circumstances: Secondary | ICD-10-CM

## 2021-05-08 LAB — LIPID PANEL
Cholesterol: 166 mg/dL (ref 0–200)
HDL: 43.1 mg/dL
LDL Cholesterol: 90 mg/dL (ref 0–99)
NonHDL: 123.06
Total CHOL/HDL Ratio: 4
Triglycerides: 164 mg/dL — ABNORMAL HIGH (ref 0.0–149.0)
VLDL: 32.8 mg/dL (ref 0.0–40.0)

## 2021-05-08 LAB — CBC WITH DIFFERENTIAL/PLATELET
Basophils Absolute: 0 10*3/uL (ref 0.0–0.1)
Basophils Relative: 0.6 % (ref 0.0–3.0)
Eosinophils Absolute: 0.1 10*3/uL (ref 0.0–0.7)
Eosinophils Relative: 1.8 % (ref 0.0–5.0)
HCT: 43.3 % (ref 39.0–52.0)
Hemoglobin: 14.5 g/dL (ref 13.0–17.0)
Lymphocytes Relative: 48.4 % — ABNORMAL HIGH (ref 12.0–46.0)
Lymphs Abs: 2.9 10*3/uL (ref 0.7–4.0)
MCHC: 33.4 g/dL (ref 30.0–36.0)
MCV: 79.8 fl (ref 78.0–100.0)
Monocytes Absolute: 0.5 10*3/uL (ref 0.1–1.0)
Monocytes Relative: 8.3 % (ref 3.0–12.0)
Neutro Abs: 2.5 10*3/uL (ref 1.4–7.7)
Neutrophils Relative %: 40.9 % — ABNORMAL LOW (ref 43.0–77.0)
Platelets: 281 10*3/uL (ref 150.0–400.0)
RBC: 5.43 Mil/uL (ref 4.22–5.81)
RDW: 14.3 % (ref 11.5–15.5)
WBC: 6 10*3/uL (ref 4.0–10.5)

## 2021-05-08 LAB — COMPREHENSIVE METABOLIC PANEL WITH GFR
ALT: 10 U/L (ref 0–53)
AST: 13 U/L (ref 0–37)
Albumin: 4.5 g/dL (ref 3.5–5.2)
Alkaline Phosphatase: 56 U/L (ref 39–117)
BUN: 11 mg/dL (ref 6–23)
CO2: 28 meq/L (ref 19–32)
Calcium: 9.9 mg/dL (ref 8.4–10.5)
Chloride: 102 meq/L (ref 96–112)
Creatinine, Ser: 0.96 mg/dL (ref 0.40–1.50)
GFR: 90.88 mL/min
Glucose, Bld: 78 mg/dL (ref 70–99)
Potassium: 4.7 meq/L (ref 3.5–5.1)
Sodium: 137 meq/L (ref 135–145)
Total Bilirubin: 0.9 mg/dL (ref 0.2–1.2)
Total Protein: 7.6 g/dL (ref 6.0–8.3)

## 2021-05-08 LAB — PSA: PSA: 0.35 ng/mL (ref 0.10–4.00)

## 2021-05-08 LAB — HEMOGLOBIN A1C: Hgb A1c MFr Bld: 5 % (ref 4.6–6.5)

## 2021-05-08 NOTE — Patient Instructions (Signed)

## 2021-05-08 NOTE — Progress Notes (Signed)
Charles Dickson 50 y.o.   Chief Complaint  Patient presents with   New Patient (Initial Visit)    Pt would like to discuss hip pain, left side, x years.    HISTORY OF PRESENT ILLNESS: This is a 50 y.o. male first visit to this office, here to establish care. Also requesting annual exam. Has occasional left-sided hip pain for years.  Worse in the morning, much better with movement and physical activity, and heat makes it better. No other complaints or medical concerns. Non-smoker.  No chronic medical problems.  No chronic medications. Inquiring about testosterone and testosterone shots.  HPI   Prior to Admission medications   Medication Sig Start Date End Date Taking? Authorizing Provider  acetaminophen (TYLENOL) 500 MG tablet Take 1,000 mg by mouth every 6 (six) hours as needed for mild pain. Patient not taking: Reported on 05/08/2021    [provider]  ibuprofen (ADVIL,MOTRIN) 200 MG tablet Take 400 mg by mouth every 6 (six) hours as needed for moderate pain.    [provider]  methocarbamol (ROBAXIN) 500 MG tablet Take 1 tablet (500 mg total) by mouth every 8 (eight) hours as needed for muscle spasms. Patient not taking: Reported on 05/08/2021 07/17/17   Petrucelli, Aldona Bar R, PA-C  naproxen (NAPROSYN) 500 MG tablet Take 1 tablet (500 mg total) by mouth 2 (two) times daily. Patient not taking: Reported on 05/08/2021 07/17/17   Petrucelli, Aldona Bar R, PA-C    No Known Allergies  Patient Active Problem List   Diagnosis Date Noted   HTN (hypertension) 06/17/2015    Past Medical History:  Diagnosis Date   Asthma    History of stomach ulcers    Hypertension     Past Surgical History:  Procedure Laterality Date   HERNIA REPAIR      Social History   Socioeconomic History   Marital status: Married    Spouse name: Not on file   Number of children: Not on file   Years of education: Not on file   Highest education level: Not on file  Occupational  History   Not on file  Tobacco Use   Smoking status: Never   Smokeless tobacco: Never  Substance and Sexual Activity   Alcohol use: No    Comment: former   Drug use: No   Sexual activity: Not on file  Other Topics Concern   Not on file  Social History Narrative   Not on file   Social Determinants of Health   Financial Resource Strain: Not on file  Food Insecurity: Not on file  Transportation Needs: Not on file  Physical Activity: Not on file  Stress: Not on file  Social Connections: Not on file  Intimate Partner Violence: Not on file    Family History  Problem Relation Age of Onset   Hypertension Mother    Alcohol abuse Father      Review of Systems  Constitutional: Negative.  Negative for chills and fever.  HENT: Negative.  Negative for congestion and sore throat.   Respiratory: Negative.  Negative for cough and shortness of breath.   Cardiovascular: Negative.  Negative for chest pain and palpitations.  Gastrointestinal: Negative.  Negative for abdominal pain, diarrhea, nausea and vomiting.  Genitourinary: Negative.   Musculoskeletal:  Positive for joint pain (Occasional left hip pain).  Skin: Negative.  Negative for rash.  Neurological: Negative.  Negative for dizziness and headaches.  All other systems reviewed and are negative.  Today's Vitals   05/08/21  0850  BP: 118/70  Pulse: 67  Temp: 98.5 F (36.9 C)  TempSrc: Oral  SpO2: 95%  Weight: 268 lb (121.6 kg)  Height: 5\' 9"  (1.753 m)   Body mass index is 39.58 kg/m.  Physical Exam Vitals reviewed.  Constitutional:      Appearance: Normal appearance.  HENT:     Head: Normocephalic.     Right Ear: Tympanic membrane, ear canal and external ear normal.     Left Ear: Tympanic membrane, ear canal and external ear normal.     Mouth/Throat:     Mouth: Mucous membranes are moist.     Pharynx: Oropharynx is clear.  Eyes:     Extraocular Movements: Extraocular movements intact.     Conjunctiva/sclera:  Conjunctivae normal.     Pupils: Pupils are equal, round, and reactive to light.  Cardiovascular:     Rate and Rhythm: Normal rate and regular rhythm.     Pulses: Normal pulses.     Heart sounds: Normal heart sounds.  Pulmonary:     Effort: Pulmonary effort is normal.     Breath sounds: Normal breath sounds.  Abdominal:     Palpations: Abdomen is soft.     Tenderness: There is no abdominal tenderness.  Musculoskeletal:     Cervical back: Normal range of motion and neck supple. No tenderness.     Right lower leg: No edema.     Left lower leg: No edema.  Lymphadenopathy:     Cervical: No cervical adenopathy.  Skin:    General: Skin is warm and dry.     Capillary Refill: Capillary refill takes less than 2 seconds.  Neurological:     General: No focal deficit present.     Mental Status: He is alert and oriented to person, place, and time.  Psychiatric:        Mood and Affect: Mood normal.        Behavior: Behavior normal.     ASSESSMENT & PLAN: Problem List Items Addressed This Visit   None Visit Diagnoses     Routine general medical examination at a health care facility    -  Primary   Encounter to establish care       Prostate cancer screening       Relevant Orders   PSA(Must document that pt has been informed of limitations of PSA testing.)   Screening for HIV (human immunodeficiency virus)       Relevant Orders   HIV antibody   Colon cancer screening       Relevant Orders   Ambulatory referral to Gastroenterology   Screening for deficiency anemia       Relevant Orders   CBC with Differential   Screening for lipoid disorders       Relevant Orders   Lipid panel   Screening for endocrine, metabolic and immunity disorder       Relevant Orders   Comprehensive metabolic panel   Hemoglobin A1c   TestT+TestF+SHBG      Modifiable risk factors discussed with patient. Anticipatory guidance according to age provided. The following topics were also discussed: Social  Determinants of Health Smoking.  Non-smoker Diet and nutrition and need to decrease amount of daily carbohydrate intake Benefits of exercise Cancer screening and need for colon cancer screening with colonoscopy Vaccinations recommendations Cardiovascular risk assessment Testosterone supplementation and cardiovascular risks associated with this Mental health including depression and anxiety Fall and accident prevention Differential diagnosis of intermittent chronic left hip pain  and how to treat and prevent it.  Patient Instructions  Health Maintenance, Male Adopting a healthy lifestyle and getting preventive care are important in promoting health and wellness. Ask your health care provider about: The right schedule for you to have regular tests and exams. Things you can do on your own to prevent diseases and keep yourself healthy. What should I know about diet, weight, and exercise? Eat a healthy diet  Eat a diet that includes plenty of vegetables, fruits, low-fat dairy products, and lean protein. Do not eat a lot of foods that are high in solid fats, added sugars, or sodium. Maintain a healthy weight Body mass index (BMI) is a measurement that can be used to identify possible weight problems. It estimates body fat based on height and weight. Your health care provider can help determine your BMI and help you achieve or maintain a healthy weight. Get regular exercise Get regular exercise. This is one of the most important things you can do for your health. Most adults should: Exercise for at least 150 minutes each week. The exercise should increase your heart rate and make you sweat (moderate-intensity exercise). Do strengthening exercises at least twice a week. This is in addition to the moderate-intensity exercise. Spend less time sitting. Even light physical activity can be beneficial. Watch cholesterol and blood lipids Have your blood tested for lipids and cholesterol at 52 years of  age, then have this test every 5 years. You may need to have your cholesterol levels checked more often if: Your lipid or cholesterol levels are high. You are older than 52 years of age. You are at high risk for heart disease. What should I know about cancer screening? Many types of cancers can be detected early and may often be prevented. Depending on your health history and family history, you may need to have cancer screening at various ages. This may include screening for: Colorectal cancer. Prostate cancer. Skin cancer. Lung cancer. What should I know about heart disease, diabetes, and high blood pressure? Blood pressure and heart disease High blood pressure causes heart disease and increases the risk of stroke. This is more likely to develop in people who have high blood pressure readings or are overweight. Talk with your health care provider about your target blood pressure readings. Have your blood pressure checked: Every 3-5 years if you are 74-72 years of age. Every year if you are 63 years old or older. If you are between the ages of 4 and 31 and are a current or former smoker, ask your health care provider if you should have a one-time screening for abdominal aortic aneurysm (AAA). Diabetes Have regular diabetes screenings. This checks your fasting blood sugar level. Have the screening done: Once every three years after age 45 if you are at a normal weight and have a low risk for diabetes. More often and at a younger age if you are overweight or have a high risk for diabetes. What should I know about preventing infection? Hepatitis B If you have a higher risk for hepatitis B, you should be screened for this virus. Talk with your health care provider to find out if you are at risk for hepatitis B infection. Hepatitis C Blood testing is recommended for: Everyone born from [birth year] through 1967. Anyone with known risk factors for hepatitis C. Sexually transmitted infections  (STIs) You should be screened each year for STIs, including gonorrhea and chlamydia, if: You are sexually active and are younger than 52 years  of age. You are older than 52 years of age and your health care provider tells you that you are at risk for this type of infection. Your sexual activity has changed since you were last screened, and you are at increased risk for chlamydia or gonorrhea. Ask your health care provider if you are at risk. Ask your health care provider about whether you are at high risk for HIV. Your health care provider may recommend a prescription medicine to help prevent HIV infection. If you choose to take medicine to prevent HIV, you should first get tested for HIV. You should then be tested every 3 months for as long as you are taking the medicine. Follow these instructions at home: Alcohol use Do not drink alcohol if your health care provider tells you not to drink. If you drink alcohol: Limit how much you have to 0-2 drinks a day. Know how much alcohol is in your drink. In the [place] one drink equals one 12 oz bottle of beer (355 mL), one 5 oz glass of wine (148 mL), or one 1 oz glass of hard liquor (44 mL). Lifestyle Do not use any products that contain nicotine or tobacco. These products include cigarettes, chewing tobacco, and vaping devices, such as e-cigarettes. If you need help quitting, ask your health care provider. Do not use street drugs. Do not share needles. Ask your health care provider for help if you need support or information about quitting drugs. General instructions Schedule regular health, dental, and eye exams. Stay current with your vaccines. Tell your health care provider if: You often feel depressed. You have ever been abused or do not feel safe at home. Summary Adopting a healthy lifestyle and getting preventive care are important in promoting health and wellness. Follow your health care provider's instructions about healthy diet,  exercising, and getting tested or screened for diseases. Follow your health care provider's instructions on monitoring your cholesterol and blood pressure. This information is not intended to replace advice given to you by your health care provider. Make sure you discuss any questions you have with your health care provider. Document Revised: 09/25/2020 Document Reviewed: 09/25/2020 Elsevier Patient Education  2022 [place], MD [facility]

## 2021-05-10 LAB — TESTT+TESTF+SHBG
Sex Hormone Binding: 22.8 nmol/L (ref 19.3–76.4)
Testosterone, Free: 11.4 pg/mL (ref 7.2–24.0)
Testosterone, Total, LC/MS: 347.5 ng/dL (ref 264.0–916.0)

## 2021-05-10 LAB — HIV ANTIBODY (ROUTINE TESTING W REFLEX): HIV 1&2 Ab, 4th Generation: NONREACTIVE

## 2021-05-15 ENCOUNTER — Telehealth: Payer: Self-pay

## 2021-05-15 NOTE — Telephone Encounter (Signed)
Pt called back for lab results. Advised pt Dr. Mitchel Honour said labs were normal but still pending for Testosterone results.  Pt expressed understand and wanted update when other lab results.  Please call pt back with that results.

## 2021-05-16 NOTE — Telephone Encounter (Signed)
Called and spoke with pt about lab results.

## 2021-05-16 NOTE — Telephone Encounter (Signed)
Adequate levels of testosterone.  Normal numbers.

## 2021-05-22 ENCOUNTER — Encounter: Payer: Self-pay | Admitting: Internal Medicine

## 2021-06-14 ENCOUNTER — Ambulatory Visit (AMBULATORY_SURGERY_CENTER): Payer: BC Managed Care – PPO

## 2021-06-14 ENCOUNTER — Other Ambulatory Visit: Payer: Self-pay

## 2021-06-14 VITALS — Ht 69.0 in | Wt 260.0 lb

## 2021-06-14 DIAGNOSIS — Z1211 Encounter for screening for malignant neoplasm of colon: Secondary | ICD-10-CM

## 2021-06-14 MED ORDER — NA SULFATE-K SULFATE-MG SULF 17.5-3.13-1.6 GM/177ML PO SOLN: 1.0000 | Freq: Once | ORAL | 0 refills | Status: AC

## 2021-06-14 NOTE — Progress Notes (Signed)
No egg or soy allergy known to patient  No issues known to pt with past sedation with any surgeries or procedures Patient denies ever being told they had issues or difficulty with intubation  No FH of Malignant Hyperthermia Pt is not on diet pills Pt is not on home 02  Pt is not on blood thinners  Pt denies issues with constipation;  No A fib or A flutter NO PA's for preps discussed with pt in PV today  Discussed with pt there will be an out-of-pocket cost for prep and that varies from $0 to 70 + dollars - pt verbalized understanding  Due to the COVID-19 pandemic we are asking patients to follow certain guidelines in PV and the [facility]   Pt aware of COVID protocols and [facility] guidelines  PV completed over the phone. Pt verified name, DOB, address and insurance during PV today.  Pt mailed instruction packet with copy of consent form to read and not return, and instructions.  Pt encouraged to call with questions or issues.  If pt has My chart, procedure instructions sent via My Chart    Suprep (generic) RX sent to pharmacy after patient given all prep options and patient decided upon Suprep;

## 2021-06-26 ENCOUNTER — Encounter: Payer: Self-pay | Admitting: Internal Medicine

## 2021-06-29 ENCOUNTER — Other Ambulatory Visit: Payer: Self-pay

## 2021-06-29 ENCOUNTER — Encounter: Payer: Self-pay | Admitting: Internal Medicine

## 2021-06-29 ENCOUNTER — Ambulatory Visit (AMBULATORY_SURGERY_CENTER): Payer: BC Managed Care – PPO | Admitting: Internal Medicine

## 2021-06-29 VITALS — BP 119/84 | HR 63 | Temp 97.3°F | Resp 17 | Ht 69.0 in | Wt 260.0 lb

## 2021-06-29 DIAGNOSIS — Z1211 Encounter for screening for malignant neoplasm of colon: Secondary | ICD-10-CM | POA: Diagnosis not present

## 2021-06-29 DIAGNOSIS — D128 Benign neoplasm of rectum: Secondary | ICD-10-CM

## 2021-06-29 DIAGNOSIS — K621 Rectal polyp: Secondary | ICD-10-CM

## 2021-06-29 MED ORDER — SODIUM CHLORIDE 0.9 % IV SOLN: 500.0000 mL | Freq: Once | INTRAVENOUS | Status: DC

## 2021-06-29 NOTE — Op Note (Signed)
[facility] Patient Name: Aarsh Fristoe Procedure Date: 06/29/2021 8:41 AM MRN: [id number] Endoscopist: Jerene Bears , MD Age: 51 Referring MD:  Date of Birth: 1971-05-12 Gender: Male Account #: [id number] Procedure:                Colonoscopy Indications:              Screening for colorectal malignant neoplasm, This                            is the patient's first colonoscopy Medicines:                Monitored Anesthesia Care Procedure:                Pre-Anesthesia Assessment:                           - Prior to the procedure, a History and Physical                            was performed, and patient medications and                            allergies were reviewed. The patient's tolerance of                            previous anesthesia was also reviewed. The risks                            and benefits of the procedure and the sedation                            options and risks were discussed with the patient.                            All questions were answered, and informed consent                            was obtained. Prior Anticoagulants: The patient has                            taken no previous anticoagulant or antiplatelet                            agents. ASA Grade Assessment: II - A patient with                            mild systemic disease. After reviewing the risks                            and benefits, the patient was deemed in                            satisfactory condition to undergo the procedure.  After obtaining informed consent, the colonoscope                            was passed under direct vision. Throughout the                            procedure, the patient's blood pressure, pulse, and                            oxygen saturations were monitored continuously. The                            Olympus CF-HQ190L [id number]) Colonoscope was                            introduced through the anus and  advanced to the                            cecum, identified by appendiceal orifice and                            ileocecal valve. The colonoscopy was performed                            without difficulty. The patient tolerated the                            procedure well. The quality of the bowel                            preparation was good. The ileocecal valve,                            appendiceal orifice, and rectum were photographed. Scope In: 8:49:47 AM Scope Out: 9:11:35 AM Scope Withdrawal Time: 0 hours 12 minutes 59 seconds  Total Procedure Duration: 0 hours 21 minutes 48 seconds  Findings:                 The digital rectal exam was normal.                           Three sessile polyps were found in the distal                            rectum. The polyps were 3 to 5 mm in size. These                            polyps were removed with a cold snare. Resection                            and retrieval were complete.                           Multiple small-mouthed diverticula were found in  the sigmoid colon.                           Internal hemorrhoids were found during                            retroflexion. The hemorrhoids were small. Complications:            No immediate complications. Estimated Blood Loss:     Estimated blood loss was minimal. Impression:               - Three 3 to 5 mm polyps in the distal rectum,                            removed with a cold snare. Resected and retrieved.                           - Diverticulosis in the sigmoid colon.                           - Small internal hemorrhoids. Recommendation:           - Patient has a contact number available for                            emergencies. The signs and symptoms of potential                            delayed complications were discussed with the                            patient. Return to normal activities tomorrow.                            Written  discharge instructions were provided to the                            patient.                           - Resume previous diet.                           - Continue present medications.                           - Await pathology results.                           - Repeat colonoscopy is recommended. The                            colonoscopy date will be determined after pathology                            results from today's exam become available for  review. Jerene Bears, MD 06/29/2021 9:20:21 AM This report has been signed electronically.

## 2021-06-29 NOTE — Progress Notes (Signed)
No problems noted in the recovery room. maw     Pt's wife, Magda Paganini, phone rings 1 x then goes to her voice mail.  Tried to call her 3 x and also went to the waiting room and no answer.

## 2021-06-29 NOTE — Progress Notes (Signed)
Called to room to assist during endoscopic procedure.  Patient ID and intended procedure confirmed with present staff. Received instructions for my participation in the procedure from the performing physician.  

## 2021-06-29 NOTE — Progress Notes (Signed)
° °  GASTROENTEROLOGY PROCEDURE H&P NOTE   Primary Care Physician: Horald Pollen, MD    Reason for Procedure:  Colorectal cancer screening  Plan:    Colonoscopy  Patient is appropriate for endoscopic procedure(s) in the ambulatory ([facility]) setting.  The nature of the procedure, as well as the risks, benefits, and alternatives were carefully and thoroughly reviewed with the patient. Ample time for discussion and questions allowed. The patient understood, was satisfied, and agreed to proceed.     HPI: Charles Dickson is a 51 y.o. male who presents for colonoscopy.  Medical history as below.  Tolerated the prep.  No recent chest pain or shortness of breath.  No abdominal pain  Past Medical History:  Diagnosis Date   Asthma    History of stomach ulcers    Hypertension     Past Surgical History:  Procedure Laterality Date   INGUINAL HERNIA REPAIR Left 2014    Prior to Admission medications   Medication Sig Start Date End Date Taking? Authorizing Provider  Naproxen Sodium (ANAPROX PO) Take 1 tablet by mouth every 8 (eight) hours as needed.    [provider]    Current Outpatient Medications  Medication Sig Dispense Refill   Naproxen Sodium (ANAPROX PO) Take 1 tablet by mouth every 8 (eight) hours as needed.     Current Facility-Administered Medications  Medication Dose Route Frequency Provider Last Rate Last Admin   0.9 %  sodium chloride infusion  500 mL Intravenous Once Kaymarie Wynn, Lajuan Lines, MD        Allergies as of 06/29/2021   (No Known Allergies)    Family History  Problem Relation Age of Onset   Hypertension Mother    Alcohol abuse Father     Social History   Socioeconomic History   Marital status: Married    Spouse name: Not on file   Number of children: Not on file   Years of education: Not on file   Highest education level: Not on file  Occupational History   Not on file  Tobacco Use   Smoking status: Never   Smokeless tobacco: Never  Vaping  Use   Vaping Use: Never used  Substance and Sexual Activity   Alcohol use: No    Comment: former   Drug use: No   Sexual activity: Not on file  Other Topics Concern   Not on file  Social History Narrative   Not on file   Social Determinants of Health   Financial Resource Strain: Not on file  Food Insecurity: Not on file  Transportation Needs: Not on file  Physical Activity: Not on file  Stress: Not on file  Social Connections: Not on file  Intimate Partner Violence: Not on file    Physical Exam: Vital signs in last 24 hours: @BP  113/68    Pulse 68    Temp (!) 97.3 F (36.3 C)    Ht 5\' 9"  (1.753 m)    Wt 260 lb (117.9 kg)    SpO2 98%    BMI 38.40 kg/m  GEN: NAD EYE: Sclerae anicteric ENT: MMM CV: Non-tachycardic Pulm: CTA b/l GI: Soft, NT/ND NEURO:  Alert & Oriented x 3   Zenovia Jarred, MD [facility]  06/29/2021 8:41 AM

## 2021-06-29 NOTE — Progress Notes (Signed)
Pt's states no medical or surgical changes since previsit or office visit. VS by CW. 

## 2021-06-29 NOTE — Patient Instructions (Addendum)
Handouts were given to your care partner on polyps, diverticulosis, and hemorrhoids. You may resume your current medications today. Await biopsy results.  May take 1-3 weeks to receive pathology results. Please call if any questions or concerns.       YOU HAD AN ENDOSCOPIC PROCEDURE TODAY AT [facility]:   Refer to the procedure report that was given to you for any specific questions about what was found during the examination.  If the procedure report does not answer your questions, please call your gastroenterologist to clarify.  If you requested that your care partner not be given the details of your procedure findings, then the procedure report has been included in a sealed envelope for you to review at your convenience later.  YOU SHOULD EXPECT: Some feelings of bloating in the abdomen. Passage of more gas than usual.  Walking can help get rid of the air that was put into your GI tract during the procedure and reduce the bloating. If you had a lower endoscopy (such as a colonoscopy or flexible sigmoidoscopy) you may notice spotting of blood in your stool or on the toilet paper. If you underwent a bowel prep for your procedure, you may not have a normal bowel movement for a few days.  Please Note:  You might notice some irritation and congestion in your nose or some drainage.  This is from the oxygen used during your procedure.  There is no need for concern and it should clear up in a day or so.  SYMPTOMS TO REPORT IMMEDIATELY:  Following lower endoscopy (colonoscopy or flexible sigmoidoscopy):  Excessive amounts of blood in the stool  Significant tenderness or worsening of abdominal pains  Swelling of the abdomen that is new, acute  Fever of 100F or higher   For urgent or emergent issues, a gastroenterologist can be reached at any hour by calling [phone]. Do not use MyChart messaging for urgent concerns.    DIET:  We do recommend a small meal at first, but  then you may proceed to your regular diet.  Drink plenty of fluids but you should avoid alcoholic beverages for 24 hours.  ACTIVITY:  You should plan to take it easy for the rest of today and you should NOT DRIVE or use heavy machinery until tomorrow (because of the sedation medicines used during the test).    FOLLOW UP: Our staff will call the number listed on your records 48-72 hours following your procedure to check on you and address any questions or concerns that you may have regarding the information given to you following your procedure. If we do not reach you, we will leave a message.  We will attempt to reach you two times.  During this call, we will ask if you have developed any symptoms of COVID 19. If you develop any symptoms (ie: fever, flu-like symptoms, shortness of breath, cough etc.) before then, please call [phone].  If you test positive for Covid 19 in the 2 weeks post procedure, please call and report this information to [place].    If any biopsies were taken you will be contacted by phone or by letter within the next 1-3 weeks.  Please call us at [phone] if you have not heard about the biopsies in 3 weeks.    SIGNATURES/CONFIDENTIALITY: You and/or your care partner have signed paperwork which will be entered into your electronic medical record.  These signatures attest to the fact that that the information above on your  After Visit Summary has been reviewed and is understood.  Full responsibility of the confidentiality of this discharge information lies with you and/or your care-partner.

## 2021-07-03 ENCOUNTER — Telehealth: Payer: Self-pay | Admitting: *Deleted

## 2021-07-03 NOTE — Telephone Encounter (Signed)
°  Follow up Call-  Call back number 06/29/2021  Post procedure Call Back phone  # [phone]  Permission to leave phone message Yes  Some recent data might be hidden     Patient questions:  Do you have a fever, pain , or abdominal swelling? No. Pain Score  0 *  Have you tolerated food without any problems? Yes.    Have you been able to return to your normal activities? Yes.    Do you have any questions about your discharge instructions: Diet   No. Medications  No. Follow up visit  No.  Do you have questions or concerns about your Care? Yes.    Actions: * If pain score is 4 or above: No action needed, pain <4.

## 2021-07-06 ENCOUNTER — Encounter: Payer: Self-pay | Admitting: Internal Medicine

## 2021-08-14 ENCOUNTER — Telehealth: Payer: Self-pay | Admitting: Internal Medicine

## 2021-08-14 NOTE — Telephone Encounter (Signed)
Dr Hilarie Fredrickson see the message from pt- please advise  ?

## 2021-08-14 NOTE — Telephone Encounter (Signed)
Patient called to seek advise. Per patient, he and his partner have noticed that his semen is a different color. Patient states he went on google to figure out why and read that it may be due to colonoscopy. Patient wants to know what they can do. Please advise ?

## 2021-08-15 NOTE — Telephone Encounter (Signed)
I am not aware of any association with propofol or colonoscopy with change in semen color ?I would have him discuss this with primary care or his urologist (if he has one) ? ?

## 2021-08-16 NOTE — Telephone Encounter (Signed)
The pt has been advised of Dr Garth Schlatter recommendations. He will f/u with PCP.  ?

## 2021-09-12 ENCOUNTER — Ambulatory Visit (INDEPENDENT_AMBULATORY_CARE_PROVIDER_SITE_OTHER): Payer: BC Managed Care – PPO | Admitting: Emergency Medicine

## 2021-09-12 ENCOUNTER — Encounter: Payer: Self-pay | Admitting: Emergency Medicine

## 2021-09-12 VITALS — BP 136/78 | HR 91 | Temp 98.3°F | Ht 69.0 in | Wt 259.4 lb

## 2021-09-12 DIAGNOSIS — R1011 Right upper quadrant pain: Secondary | ICD-10-CM

## 2021-09-12 DIAGNOSIS — Z0000 Encounter for general adult medical examination without abnormal findings: Secondary | ICD-10-CM | POA: Diagnosis not present

## 2021-09-12 DIAGNOSIS — Z125 Encounter for screening for malignant neoplasm of prostate: Secondary | ICD-10-CM

## 2021-09-12 DIAGNOSIS — Z130 Encounter for screening for diseases of the blood and blood-forming organs and certain disorders involving the immune mechanism: Secondary | ICD-10-CM | POA: Diagnosis not present

## 2021-09-12 DIAGNOSIS — Z23 Encounter for immunization: Secondary | ICD-10-CM | POA: Diagnosis not present

## 2021-09-12 DIAGNOSIS — Z1329 Encounter for screening for other suspected endocrine disorder: Secondary | ICD-10-CM

## 2021-09-12 DIAGNOSIS — Z13220 Encounter for screening for lipoid disorders: Secondary | ICD-10-CM | POA: Diagnosis not present

## 2021-09-12 DIAGNOSIS — Z13228 Encounter for screening for other metabolic disorders: Secondary | ICD-10-CM

## 2021-09-12 DIAGNOSIS — Z8379 Family history of other diseases of the digestive system: Secondary | ICD-10-CM | POA: Diagnosis not present

## 2021-09-12 LAB — COMPREHENSIVE METABOLIC PANEL
ALT: 15 U/L (ref 0–53)
AST: 21 U/L (ref 0–37)
Albumin: 4.9 g/dL (ref 3.5–5.2)
Alkaline Phosphatase: 64 U/L (ref 39–117)
BUN: 16 mg/dL (ref 6–23)
CO2: 26 mEq/L (ref 19–32)
Calcium: 9.7 mg/dL (ref 8.4–10.5)
Chloride: 102 mEq/L (ref 96–112)
Creatinine, Ser: 1.22 mg/dL (ref 0.40–1.50)
GFR: 68 mL/min (ref >60.00)
Glucose, Bld: 94 mg/dL (ref 70–99)
Potassium: 3.9 mEq/L (ref 3.5–5.1)
Sodium: 138 mEq/L (ref 135–145)
Total Bilirubin: 0.9 mg/dL (ref 0.2–1.2)
Total Protein: 7.9 g/dL (ref 6.0–8.3)

## 2021-09-12 LAB — CBC WITH DIFFERENTIAL/PLATELET
Basophils Absolute: 0 10*3/uL (ref 0.0–0.1)
Basophils Relative: 0.5 % (ref 0.0–3.0)
Eosinophils Absolute: 0.1 10*3/uL (ref 0.0–0.7)
Eosinophils Relative: 1.4 % (ref 0.0–5.0)
HCT: 43.3 % (ref 39.0–52.0)
Hemoglobin: 14.6 g/dL (ref 13.0–17.0)
Lymphocytes Relative: 46.4 % — ABNORMAL HIGH (ref 12.0–46.0)
Lymphs Abs: 3.6 10*3/uL (ref 0.7–4.0)
MCHC: 33.6 g/dL (ref 30.0–36.0)
MCV: 79 fl (ref 78.0–100.0)
Monocytes Absolute: 0.6 10*3/uL (ref 0.1–1.0)
Monocytes Relative: 7.1 % (ref 3.0–12.0)
Neutro Abs: 3.5 10*3/uL (ref 1.4–7.7)
Neutrophils Relative %: 44.6 % (ref 43.0–77.0)
Platelets: 295 10*3/uL (ref 150.0–400.0)
RBC: 5.48 Mil/uL (ref 4.22–5.81)
RDW: 14.2 % (ref 11.5–15.5)
WBC: 7.7 10*3/uL (ref 4.0–10.5)

## 2021-09-12 LAB — LIPID PANEL
Cholesterol: 189 mg/dL (ref 0–200)
HDL: 49.3 mg/dL (ref >39.00)
LDL Cholesterol: 111 mg/dL — ABNORMAL HIGH (ref 0–99)
NonHDL: 139.32
Total CHOL/HDL Ratio: 4
Triglycerides: 144 mg/dL (ref 0.0–149.0)
VLDL: 28.8 mg/dL (ref 0.0–40.0)

## 2021-09-12 LAB — HEMOGLOBIN A1C: Hgb A1c MFr Bld: 4.9 % (ref 4.6–6.5)

## 2021-09-12 LAB — PSA: PSA: 0.21 ng/mL (ref 0.10–4.00)

## 2021-09-12 LAB — TSH: TSH: 0.6 u[IU]/mL (ref 0.35–5.50)

## 2021-09-12 NOTE — Patient Instructions (Signed)
Health Maintenance, Male Adopting a healthy lifestyle and getting preventive care are important in promoting health and wellness. Ask your health care provider about: The right schedule for you to have regular tests and exams. Things you can do on your own to prevent diseases and keep yourself healthy. What should I know about diet, weight, and exercise? Eat a healthy diet  Eat a diet that includes plenty of vegetables, fruits, low-fat dairy products, and lean protein. Do not eat a lot of foods that are high in solid fats, added sugars, or sodium. Maintain a healthy weight Body mass index (BMI) is a measurement that can be used to identify possible weight problems. It estimates body fat based on height and weight. Your health care provider can help determine your BMI and help you achieve or maintain a healthy weight. Get regular exercise Get regular exercise. This is one of the most important things you can do for your health. Most adults should: Exercise for at least 150 minutes each week. The exercise should increase your heart rate and make you sweat (moderate-intensity exercise). Do strengthening exercises at least twice a week. This is in addition to the moderate-intensity exercise. Spend less time sitting. Even light physical activity can be beneficial. Watch cholesterol and blood lipids Have your blood tested for lipids and cholesterol at 53 years of age, then have this test every 5 years. You may need to have your cholesterol levels checked more often if: Your lipid or cholesterol levels are high. You are older than 53 years of age. You are at high risk for heart disease. What should I know about cancer screening? Many types of cancers can be detected early and may often be prevented. Depending on your health history and family history, you may need to have cancer screening at various ages. This may include screening for: Colorectal cancer. Prostate cancer. Skin cancer. Lung  cancer. What should I know about heart disease, diabetes, and high blood pressure? Blood pressure and heart disease High blood pressure causes heart disease and increases the risk of stroke. This is more likely to develop in people who have high blood pressure readings or are overweight. Talk with your health care provider about your target blood pressure readings. Have your blood pressure checked: Every 3-5 years if you are 18-39 years of age. Every year if you are 40 years old or older. If you are between the ages of 65 and 75 and are a current or former smoker, ask your health care provider if you should have a one-time screening for abdominal aortic aneurysm (AAA). Diabetes Have regular diabetes screenings. This checks your fasting blood sugar level. Have the screening done: Once every three years after age 45 if you are at a normal weight and have a low risk for diabetes. More often and at a younger age if you are overweight or have a high risk for diabetes. What should I know about preventing infection? Hepatitis B If you have a higher risk for hepatitis B, you should be screened for this virus. Talk with your health care provider to find out if you are at risk for hepatitis B infection. Hepatitis C Blood testing is recommended for: Everyone born from 1945 through 1965. Anyone with known risk factors for hepatitis C. Sexually transmitted infections (STIs) You should be screened each year for STIs, including gonorrhea and chlamydia, if: You are sexually active and are younger than 53 years of age. You are older than 53 years of age and your   health care provider tells you that you are at risk for this type of infection. Your sexual activity has changed since you were last screened, and you are at increased risk for chlamydia or gonorrhea. Ask your health care provider if you are at risk. Ask your health care provider about whether you are at high risk for HIV. Your health care provider  may recommend a prescription medicine to help prevent HIV infection. If you choose to take medicine to prevent HIV, you should first get tested for HIV. You should then be tested every 3 months for as long as you are taking the medicine. Follow these instructions at home: Alcohol use Do not drink alcohol if your health care provider tells you not to drink. If you drink alcohol: Limit how much you have to 0-2 drinks a day. Know how much alcohol is in your drink. In the [place] one drink equals one 12 oz bottle of beer (355 mL), one 5 oz glass of wine (148 mL), or one 1 oz glass of hard liquor (44 mL). Lifestyle Do not use any products that contain nicotine or tobacco. These products include cigarettes, chewing tobacco, and vaping devices, such as e-cigarettes. If you need help quitting, ask your health care provider. Do not use street drugs. Do not share needles. Ask your health care provider for help if you need support or information about quitting drugs. General instructions Schedule regular health, dental, and eye exams. Stay current with your vaccines. Tell your health care provider if: You often feel depressed. You have ever been abused or do not feel safe at home. Summary Adopting a healthy lifestyle and getting preventive care are important in promoting health and wellness. Follow your health care provider's instructions about healthy diet, exercising, and getting tested or screened for diseases. Follow your health care provider's instructions on monitoring your cholesterol and blood pressure. This information is not intended to replace advice given to you by your health care provider. Make sure you discuss any questions you have with your health care provider. Document Revised: 09/25/2020 Document Reviewed: 09/25/2020 Elsevier Patient Education  2023 Elsevier Inc.  

## 2021-09-12 NOTE — Progress Notes (Addendum)
Charles Dickson 51 y.o.   Chief Complaint  Patient presents with   Annual Exam    No concerns     HISTORY OF PRESENT ILLNESS: This is a 51 y.o. male here for annual exam. Healthy male with a healthy lifestyle. Has occasional postprandial right upper quadrant episodes of pain.  Family history of gallstones. Non-smoker. Recent colonoscopy showed rectal polyps, internal hemorrhoids, and diverticulosis. No other complaints or medical concerns today.  HPI   Prior to Admission medications   Medication Sig Start Date End Date Taking? Authorizing Provider  Naproxen Sodium (ANAPROX PO) Take 1 tablet by mouth every 8 (eight) hours as needed. Patient not taking: Reported on 09/12/2021    [provider]    No Known Allergies  Patient Active Problem List   Diagnosis Date Noted   HTN (hypertension) 06/17/2015    Past Medical History:  Diagnosis Date   Asthma    History of stomach ulcers    Hypertension     Past Surgical History:  Procedure Laterality Date   INGUINAL HERNIA REPAIR Left 2014    Social History   Socioeconomic History   Marital status: Married    Spouse name: Not on file   Number of children: Not on file   Years of education: Not on file   Highest education level: Not on file  Occupational History   Not on file  Tobacco Use   Smoking status: Never   Smokeless tobacco: Never  Vaping Use   Vaping Use: Never used  Substance and Sexual Activity   Alcohol use: No    Comment: former   Drug use: No   Sexual activity: Not on file  Other Topics Concern   Not on file  Social History Narrative   Not on file   Social Determinants of Health   Financial Resource Strain: Not on file  Food Insecurity: Not on file  Transportation Needs: Not on file  Physical Activity: Not on file  Stress: Not on file  Social Connections: Not on file  Intimate Partner Violence: Not on file    Family History  Problem Relation Age of Onset   Hypertension Mother     Alcohol abuse Father      Review of Systems  Constitutional: Negative.  Negative for chills and fever.  HENT: Negative.  Negative for congestion and sore throat.   Eyes: Negative.   Respiratory: Negative.  Negative for cough and shortness of breath.   Cardiovascular: Negative.  Negative for chest pain and palpitations.  Gastrointestinal: Negative.  Negative for abdominal pain, diarrhea, nausea and vomiting.  Genitourinary: Negative.   Skin: Negative.  Negative for rash.  Neurological: Negative.  Negative for dizziness and headaches.  All other systems reviewed and are negative. Today's Vitals   09/12/21 1428  BP: 136/78  Pulse: 91  Temp: 98.3 F (36.8 C)  TempSrc: Oral  SpO2: 92%  Weight: 259 lb 6 oz (117.7 kg)  Height: 5\' 9"  (1.753 m)   Body mass index is 38.3 kg/m.   Physical Exam Vitals reviewed.  Constitutional:      Appearance: Normal appearance.  HENT:     Head: Normocephalic.     Right Ear: Tympanic membrane, ear canal and external ear normal.     Left Ear: Tympanic membrane, ear canal and external ear normal.     Mouth/Throat:     Mouth: Mucous membranes are moist.     Pharynx: Oropharynx is clear.  Eyes:     Extraocular Movements:  Extraocular movements intact.     Conjunctiva/sclera: Conjunctivae normal.     Pupils: Pupils are equal, round, and reactive to light.  Cardiovascular:     Rate and Rhythm: Normal rate and regular rhythm.     Pulses: Normal pulses.     Heart sounds: Normal heart sounds.  Pulmonary:     Effort: Pulmonary effort is normal.     Breath sounds: Normal breath sounds.  Abdominal:     General: Bowel sounds are normal. There is no distension.     Palpations: Abdomen is soft.     Tenderness: There is no abdominal tenderness.  Musculoskeletal:        General: Normal range of motion.     Cervical back: No tenderness.  Lymphadenopathy:     Cervical: No cervical adenopathy.  Skin:    General: Skin is warm and dry.     Capillary  Refill: Capillary refill takes less than 2 seconds.  Neurological:     General: No focal deficit present.     Mental Status: He is alert and oriented to person, place, and time.  Psychiatric:        Mood and Affect: Mood normal.        Behavior: Behavior normal.     ASSESSMENT & PLAN: Problem List Items Addressed This Visit   None Visit Diagnoses     Routine general medical examination at a health care facility    -  Primary   Need for vaccination       Relevant Orders   Varicella-zoster vaccine IM (Shingrix) (Completed)   Right upper quadrant pain       Relevant Orders   US Abdomen Limited RUQ (LIVER/GB)   CBC with Differential   Comprehensive metabolic panel   Family history of gallstones       Relevant Orders   US Abdomen Limited RUQ (LIVER/GB)   Prostate cancer screening       Relevant Orders   PSA(Must document that pt has been informed of limitations of PSA testing.)   Screening for deficiency anemia       Relevant Orders   CBC with Differential   Screening for lipoid disorders       Relevant Orders   Lipid panel   Screening for endocrine, metabolic and immunity disorder       Relevant Orders   Comprehensive metabolic panel   Hemoglobin A1c   TSH      Modifiable risk factors discussed with patient. Anticipatory guidance according to age provided. The following topics were also discussed: Social Determinants of Health Smoking.  Non-smoker Diet and nutrition and need to decrease amount of daily carbohydrate intake Benefits of exercise Cancer screening and review of recent colonoscopy report done in February 2023 Vaccinations recommendations Cardiovascular risk assessment and need for blood work The 10-year ASCVD risk score (Arnett DK, et al., 2019) is: 6.4%   Values used to calculate the score:     Age: 18 years     Sex: Male     Is Non-Hispanic African American: Yes     Diabetic: No     Tobacco smoker: No     Systolic Blood Pressure: 136 mmHg     Is  BP treated: No     HDL Cholesterol: 43.1 mg/dL     Total Cholesterol: 166 mg/dL  Mental health including depression and anxiety Fall and accident prevention  Patient Instructions  Health Maintenance, Male Adopting a healthy lifestyle and getting preventive care are important  in promoting health and wellness. Ask your health care provider about: The right schedule for you to have regular tests and exams. Things you can do on your own to prevent diseases and keep yourself healthy. What should I know about diet, weight, and exercise? Eat a healthy diet  Eat a diet that includes plenty of vegetables, fruits, low-fat dairy products, and lean protein. Do not eat a lot of foods that are high in solid fats, added sugars, or sodium. Maintain a healthy weight Body mass index (BMI) is a measurement that can be used to identify possible weight problems. It estimates body fat based on height and weight. Your health care provider can help determine your BMI and help you achieve or maintain a healthy weight. Get regular exercise Get regular exercise. This is one of the most important things you can do for your health. Most adults should: Exercise for at least 150 minutes each week. The exercise should increase your heart rate and make you sweat (moderate-intensity exercise). Do strengthening exercises at least twice a week. This is in addition to the moderate-intensity exercise. Spend less time sitting. Even light physical activity can be beneficial. Watch cholesterol and blood lipids Have your blood tested for lipids and cholesterol at 51 years of age, then have this test every 5 years. You may need to have your cholesterol levels checked more often if: Your lipid or cholesterol levels are high. You are older than 53 years of age. You are at high risk for heart disease. What should I know about cancer screening? Many types of cancers can be detected early and may often be prevented. Depending on  your health history and family history, you may need to have cancer screening at various ages. This may include screening for: Colorectal cancer. Prostate cancer. Skin cancer. Lung cancer. What should I know about heart disease, diabetes, and high blood pressure? Blood pressure and heart disease High blood pressure causes heart disease and increases the risk of stroke. This is more likely to develop in people who have high blood pressure readings or are overweight. Talk with your health care provider about your target blood pressure readings. Have your blood pressure checked: Every 3-5 years if you are 46-53 years of age. Every year if you are 51 years old or older. If you are between the ages of 35 and 30 and are a current or former smoker, ask your health care provider if you should have a one-time screening for abdominal aortic aneurysm (AAA). Diabetes Have regular diabetes screenings. This checks your fasting blood sugar level. Have the screening done: Once every three years after age 10 if you are at a normal weight and have a low risk for diabetes. More often and at a younger age if you are overweight or have a high risk for diabetes. What should I know about preventing infection? Hepatitis B If you have a higher risk for hepatitis B, you should be screened for this virus. Talk with your health care provider to find out if you are at risk for hepatitis B infection. Hepatitis C Blood testing is recommended for: Everyone born from [birth year] through 1965. Anyone with known risk factors for hepatitis C. Sexually transmitted infections (STIs) You should be screened each year for STIs, including gonorrhea and chlamydia, if: You are sexually active and are younger than 53 years of age. You are older than 53 years of age and your health care provider tells you that you are at risk for this type  of infection. Your sexual activity has changed since you were last screened, and you are at increased  risk for chlamydia or gonorrhea. Ask your health care provider if you are at risk. Ask your health care provider about whether you are at high risk for HIV. Your health care provider may recommend a prescription medicine to help prevent HIV infection. If you choose to take medicine to prevent HIV, you should first get tested for HIV. You should then be tested every 3 months for as long as you are taking the medicine. Follow these instructions at home: Alcohol use Do not drink alcohol if your health care provider tells you not to drink. If you drink alcohol: Limit how much you have to 0-2 drinks a day. Know how much alcohol is in your drink. In the [place] one drink equals one 12 oz bottle of beer (355 mL), one 5 oz glass of wine (148 mL), or one 1 oz glass of hard liquor (44 mL). Lifestyle Do not use any products that contain nicotine or tobacco. These products include cigarettes, chewing tobacco, and vaping devices, such as e-cigarettes. If you need help quitting, ask your health care provider. Do not use street drugs. Do not share needles. Ask your health care provider for help if you need support or information about quitting drugs. General instructions Schedule regular health, dental, and eye exams. Stay current with your vaccines. Tell your health care provider if: You often feel depressed. You have ever been abused or do not feel safe at home. Summary Adopting a healthy lifestyle and getting preventive care are important in promoting health and wellness. Follow your health care provider's instructions about healthy diet, exercising, and getting tested or screened for diseases. Follow your health care provider's instructions on monitoring your cholesterol and blood pressure. This information is not intended to replace advice given to you by your health care provider. Make sure you discuss any questions you have with your health care provider. Document Revised: 09/25/2020 Document  Reviewed: 09/25/2020 Elsevier Patient Education  2023 Elsevier Inc.     Edwina Barth, MD  Primary Care at [facility]

## 2021-09-18 ENCOUNTER — Ambulatory Visit
Admission: RE | Admit: 2021-09-18 | Discharge: 2021-09-18 | Disposition: A | Discharge status: Home / Self Care | Payer: BC Managed Care – PPO | Source: Ambulatory Visit | Attending: Emergency Medicine | Admitting: Emergency Medicine

## 2021-09-18 DIAGNOSIS — R1011 Right upper quadrant pain: Secondary | ICD-10-CM

## 2021-09-18 DIAGNOSIS — Z8379 Family history of other diseases of the digestive system: Secondary | ICD-10-CM

## 2021-09-18 DIAGNOSIS — R1901 Right upper quadrant abdominal swelling, mass and lump: Secondary | ICD-10-CM | POA: Diagnosis not present

## 2022-03-27 ENCOUNTER — Ambulatory Visit: Payer: BC Managed Care – PPO | Admitting: Emergency Medicine

## 2022-03-27 ENCOUNTER — Encounter: Payer: Self-pay | Admitting: Emergency Medicine

## 2022-03-27 VITALS — BP 134/80 | HR 74 | Temp 98.2°F | Ht 69.0 in | Wt 264.5 lb

## 2022-03-27 DIAGNOSIS — N5201 Erectile dysfunction due to arterial insufficiency: Secondary | ICD-10-CM

## 2022-03-27 DIAGNOSIS — B351 Tinea unguium: Secondary | ICD-10-CM | POA: Diagnosis not present

## 2022-03-27 DIAGNOSIS — Z23 Encounter for immunization: Secondary | ICD-10-CM | POA: Diagnosis not present

## 2022-03-27 MED ORDER — SILDENAFIL CITRATE 100 MG PO TABS: 50.0000 mg | ORAL_TABLET | Freq: Every day | ORAL | 11 refills | Status: DC | PRN

## 2022-03-27 NOTE — Patient Instructions (Signed)

## 2022-03-27 NOTE — Assessment & Plan Note (Signed)
Multiple toenails. Needs podiatry evaluation. Referral placed today.

## 2022-03-27 NOTE — Assessment & Plan Note (Signed)
May benefit from Viagra 100 mg as needed.

## 2022-03-27 NOTE — Progress Notes (Signed)
Charles Dickson 51 y.o.   Chief Complaint  Patient presents with   Referral    Referral to podiatry, concern about his feet     HISTORY OF PRESENT ILLNESS: This is a 51 y.o. male A1A complaining of toenail fungal infection.  Requesting podiatry referral Also complaining of erectile dysfunction. No other complaints or medical concerns today.  HPI   Prior to Admission medications   Not on File    No Known Allergies  Patient Active Problem List   Diagnosis Date Noted   HTN (hypertension) 06/17/2015    Past Medical History:  Diagnosis Date   Asthma    History of stomach ulcers    Hypertension     Past Surgical History:  Procedure Laterality Date   INGUINAL HERNIA REPAIR Left 2014    Social History   Socioeconomic History   Marital status: Married    Spouse name: Not on file   Number of children: Not on file   Years of education: Not on file   Highest education level: Not on file  Occupational History   Not on file  Tobacco Use   Smoking status: Never   Smokeless tobacco: Never  Vaping Use   Vaping Use: Never used  Substance and Sexual Activity   Alcohol use: No    Comment: former   Drug use: No   Sexual activity: Not on file  Other Topics Concern   Not on file  Social History Narrative   Not on file   Social Determinants of Health   Financial Resource Strain: Not on file  Food Insecurity: Not on file  Transportation Needs: Not on file  Physical Activity: Not on file  Stress: Not on file  Social Connections: Not on file  Intimate Partner Violence: Not on file    Family History  Problem Relation Age of Onset   Hypertension Mother    Alcohol abuse Father      Review of Systems  Constitutional: Negative.  Negative for chills and fever.  HENT: Negative.  Negative for congestion and sore throat.   Respiratory: Negative.  Negative for cough and shortness of breath.   Cardiovascular: Negative.  Negative for chest pain and palpitations.   Gastrointestinal:  Negative for abdominal pain, nausea and vomiting.  Genitourinary:        Erectile dysfunction  Skin: Negative.  Negative for rash.  All other systems reviewed and are negative.  Today's Vitals   03/27/22 1442  BP: 134/80  Pulse: 74  Temp: 98.2 F (36.8 C)  TempSrc: Oral  SpO2: 92%  Weight: 264 lb 8 oz (120 kg)  Height: '5\' 9"'$  (1.753 m)   Body mass index is 39.06 kg/m.   Physical Exam Vitals reviewed.  Constitutional:      Appearance: Normal appearance.  HENT:     Head: Normocephalic.  Eyes:     Extraocular Movements: Extraocular movements intact.  Cardiovascular:     Rate and Rhythm: Normal rate.  Pulmonary:     Effort: Pulmonary effort is normal.  Skin:    General: Skin is warm and dry.     Comments: Onychomycosis several toenails  Neurological:     Mental Status: He is alert and oriented to person, place, and time.  Psychiatric:        Mood and Affect: Mood normal.        Behavior: Behavior normal.      ASSESSMENT & PLAN: A total of 30 minutes was spent with the patient and counseling/coordination  of care regarding preparing for this visit, review of most recent office visit notes, diagnosis of onychomycosis and need for podiatry evaluation, diagnosis of erectile dysfunction and treatments available including Viagra, instructions for use and side effects, prognosis, documentation, and need for follow-up.  Problem List Items Addressed This Visit       Cardiovascular and Mediastinum   Erectile dysfunction due to arterial insufficiency    May benefit from Viagra 100 mg as needed.      Relevant Medications   sildenafil (VIAGRA) 100 MG tablet     Musculoskeletal and Integument   Onychomycosis - Primary    Multiple toenails. Needs podiatry evaluation. Referral placed today.      Relevant Orders   Ambulatory referral to Podiatry   Other Visit Diagnoses     Need for vaccination       Relevant Orders   Zoster Recombinant (Shingrix  ) (Completed)      Patient Instructions  Fungal Nail Infection A fungal nail infection is a common infection of the toenails or fingernails. This condition affects toenails more often than fingernails. It often affects the great, or big, toes. More than one nail may be infected. The condition can be passed from person to person (is contagious). What are the causes? This condition is caused by a fungus, such as yeast or molds. Several types of fungi can cause the infection. These fungi are common in moist and warm areas. If your hands or feet come into contact with the fungus, it may get into a crack in your fingernail or toenail or in the surrounding skin, and cause an infection. What increases the risk? The following factors may make you more likely to develop this condition: Being of older age. Having certain medical conditions, such as: Athlete's foot. Diabetes. Poor circulation. A weak body defense system (immune system). Walking barefoot in areas where the fungus thrives, such as showers or locker rooms. Wearing shoes and socks that cause your feet to sweat. Having a nail injury or a recent nail surgery. What are the signs or symptoms? Symptoms of this condition include: A pale spot on the nail. Thickening of the nail. A nail that becomes yellow, brown, or white. A brittle or ragged nail edge. A nail that has lifted away from the nail bed. How is this diagnosed? This condition is diagnosed with a physical exam. Your health care provider may take a scraping or clipping from your nail to test for the fungus. How is this treated? Treatment is not needed for mild infections. If you have significant nail changes, treatment may include: Antifungal medicines taken by mouth (orally). You may need to take the medicine for several weeks or several months, and you may not see the results for a long time. These medicines can cause side effects. Ask your health care provider what problems to  watch for. Antifungal nail polish or nail cream. These may be used along with oral antifungal medicines. Laser treatment of the nail. Surgery to remove the nail. This may be needed for the most severe infections. It can take a long time, usually up to a year, for the infection to go away. The infection may also come back. Follow these instructions at home: Medicines Take or apply over-the-counter and prescription medicines only as told by your health care provider. Ask your health care provider about using over-the-counter mentholated ointment on your nails. Nail care Trim your nails often. Wash and dry your hands and feet every day. Keep your feet  dry. To do this: Wear absorbent socks, and change your socks frequently. Wear shoes that allow air to circulate, such as sandals or canvas tennis shoes. Throw out old shoes. If you go to a nail salon, make sure you choose one that uses clean instruments. Use antifungal foot powder on your feet and in your shoes. General instructions Do not share personal items, such as towels or nail clippers. Do not walk barefoot in shower rooms or locker rooms. Wear rubber gloves if you are working with your hands in wet areas. Keep all follow-up visits. This is important. Contact a health care provider if: You have redness, pain, or pus near the toenail or fingernail. Your infection is not getting better, or it is getting worse after several months. You have more circulation problems near the toenail or fingernail. You have brown or black discoloration of the nail that spreads to the surrounding skin. Summary A fungal nail infection is a common infection of the toenails or fingernails. Treatment is not needed for mild infections. If you have significant nail changes, treatment may include taking medicine orally and applying medicine to your nails. It can take a long time, usually up to a year, for the infection to go away. The infection may also come  back. Take or apply over-the-counter and prescription medicines only as told by your health care provider. This information is not intended to replace advice given to you by your health care provider. Make sure you discuss any questions you have with your health care provider. Document Revised: 08/07/2020 Document Reviewed: 08/07/2020 Elsevier Patient Education  [place], MD [facility]

## 2022-04-08 ENCOUNTER — Ambulatory Visit: Payer: BC Managed Care – PPO | Admitting: Podiatry

## 2022-04-08 DIAGNOSIS — B351 Tinea unguium: Secondary | ICD-10-CM | POA: Diagnosis not present

## 2022-04-08 MED ORDER — TERBINAFINE HCL 250 MG PO TABS: 250.0000 mg | ORAL_TABLET | Freq: Every day | ORAL | 0 refills | Status: AC

## 2022-04-09 NOTE — Progress Notes (Signed)
  Subjective:  Patient ID: Charles Dickson, male    DOB: 1971-05-10,  MRN: [id number]  Chief Complaint  Patient presents with   Nail Problem    Nail trim     51 y.o. male presents with the above complaint. History confirmed with patient.  Nails are thickened elongated and causing discomfort  Objective:  Physical Exam: warm, good capillary refill, no trophic changes or ulcerative lesions, normal DP and PT pulses, and normal sensory exam. Left Foot: dystrophic yellowed discolored nail plates with subungual debris Right Foot: dystrophic yellowed discolored nail plates with subungual debris      Assessment:   1. Onychomycosis      Plan:  Patient was evaluated and treated and all questions answered.  Discussed the etiology and treatment options for the condition in detail with the patient. Educated patient on the topical and oral treatment options for mycotic nails. Recommended debridement of the nails today. Sharp and mechanical debridement performed of all painful and mycotic nails today. Nails debrided in length and thickness using a nail nipper to level of comfort. Discussed treatment options including appropriate shoe gear. Follow up as needed for painful nails.  We discussed treatment with oral Lamisil.  Use and side effects reviewed.  Rx sent to pharmacy.  I will see him back in 3 months for follow-up    Return in about 3 months (around 07/09/2022) for follow up after nail fungus treatment.

## 2022-06-13 ENCOUNTER — Encounter: Payer: Self-pay | Admitting: Emergency Medicine

## 2022-06-13 ENCOUNTER — Ambulatory Visit: Payer: BC Managed Care – PPO | Admitting: Emergency Medicine

## 2022-06-13 VITALS — BP 132/86 | HR 76 | Temp 98.5°F | Ht 69.0 in | Wt 266.1 lb

## 2022-06-13 DIAGNOSIS — I10 Essential (primary) hypertension: Secondary | ICD-10-CM

## 2022-06-13 DIAGNOSIS — Z6839 Body mass index (BMI) 39.0-39.9, adult: Secondary | ICD-10-CM | POA: Diagnosis not present

## 2022-06-13 DIAGNOSIS — R7303 Prediabetes: Secondary | ICD-10-CM

## 2022-06-13 LAB — CBC WITH DIFFERENTIAL/PLATELET
Basophils Absolute: 0 10*3/uL (ref 0.0–0.1)
Basophils Relative: 0.5 % (ref 0.0–3.0)
Eosinophils Absolute: 0.1 10*3/uL (ref 0.0–0.7)
Eosinophils Relative: 1.5 % (ref 0.0–5.0)
HCT: 43.6 % (ref 39.0–52.0)
Hemoglobin: 14.5 g/dL (ref 13.0–17.0)
Lymphocytes Relative: 49.3 % — ABNORMAL HIGH (ref 12.0–46.0)
Lymphs Abs: 3.8 10*3/uL (ref 0.7–4.0)
MCHC: 33.2 g/dL (ref 30.0–36.0)
MCV: 79.7 fl (ref 78.0–100.0)
Monocytes Absolute: 0.7 10*3/uL (ref 0.1–1.0)
Monocytes Relative: 9.7 % (ref 3.0–12.0)
Neutro Abs: 3 10*3/uL (ref 1.4–7.7)
Neutrophils Relative %: 39 % — ABNORMAL LOW (ref 43.0–77.0)
Platelets: 298 10*3/uL (ref 150.0–400.0)
RBC: 5.47 Mil/uL (ref 4.22–5.81)
RDW: 14.4 % (ref 11.5–15.5)
WBC: 7.6 10*3/uL (ref 4.0–10.5)

## 2022-06-13 LAB — COMPREHENSIVE METABOLIC PANEL
ALT: 15 U/L (ref 0–53)
AST: 21 U/L (ref 0–37)
Albumin: 4.8 g/dL (ref 3.5–5.2)
Alkaline Phosphatase: 69 U/L (ref 39–117)
BUN: 18 mg/dL (ref 6–23)
CO2: 29 mEq/L (ref 19–32)
Calcium: 9.7 mg/dL (ref 8.4–10.5)
Chloride: 102 mEq/L (ref 96–112)
Creatinine, Ser: 1.21 mg/dL (ref 0.40–1.50)
GFR: 68.31 mL/min (ref >60.00)
Glucose, Bld: 83 mg/dL (ref 70–99)
Potassium: 4.1 mEq/L (ref 3.5–5.1)
Sodium: 139 mEq/L (ref 135–145)
Total Bilirubin: 0.6 mg/dL (ref 0.2–1.2)
Total Protein: 8.1 g/dL (ref 6.0–8.3)

## 2022-06-13 LAB — LIPID PANEL
Cholesterol: 195 mg/dL (ref 0–200)
HDL: 47.4 mg/dL (ref >39.00)
LDL Cholesterol: 125 mg/dL — ABNORMAL HIGH (ref 0–99)
NonHDL: 147.65
Total CHOL/HDL Ratio: 4
Triglycerides: 111 mg/dL (ref 0.0–149.0)
VLDL: 22.2 mg/dL (ref 0.0–40.0)

## 2022-06-13 LAB — HEMOGLOBIN A1C: Hgb A1c MFr Bld: 5.1 % (ref 4.6–6.5)

## 2022-06-13 NOTE — Assessment & Plan Note (Signed)
Well-controlled off medication. BP Readings from Last 3 Encounters:  06/13/22 132/86  03/27/22 134/80  09/12/21 136/78

## 2022-06-13 NOTE — Progress Notes (Signed)
Charles Dickson 52 y.o.   Chief Complaint  Patient presents with   Follow-up    Diabetes management     HISTORY OF PRESENT ILLNESS: This is a 52 y.o. male A!A concerned about possible diabetes Interested in weight management.  Inquiring about GLP-1 agonist. No other complaints or medical concerns today.  HPI   Prior to Admission medications   Medication Sig Start Date End Date Taking? Authorizing Provider  sildenafil (VIAGRA) 100 MG tablet Take 0.5-1 tablets (50-100 mg total) by mouth daily as needed for erectile dysfunction. 03/27/22  Yes Beauty Pless, Ines Bloomer, MD  terbinafine (LAMISIL) 250 MG tablet Take 1 tablet (250 mg total) by mouth daily. 04/08/22 07/07/22 Yes McDonald, Stephan Minister, DPM    No Known Allergies  Patient Active Problem List   Diagnosis Date Noted   Onychomycosis 03/27/2022   Erectile dysfunction due to arterial insufficiency 03/27/2022   HTN (hypertension) 06/17/2015    Past Medical History:  Diagnosis Date   Asthma    History of stomach ulcers    Hypertension     Past Surgical History:  Procedure Laterality Date   INGUINAL HERNIA REPAIR Left 2014    Social History   Socioeconomic History   Marital status: Married    Spouse name: Not on file   Number of children: Not on file   Years of education: Not on file   Highest education level: Not on file  Occupational History   Not on file  Tobacco Use   Smoking status: Never   Smokeless tobacco: Never  Vaping Use   Vaping Use: Never used  Substance and Sexual Activity   Alcohol use: No    Comment: former   Drug use: No   Sexual activity: Not on file  Other Topics Concern   Not on file  Social History Narrative   Not on file   Social Determinants of Health   Financial Resource Strain: Not on file  Food Insecurity: Not on file  Transportation Needs: Not on file  Physical Activity: Not on file  Stress: Not on file  Social Connections: Not on file  Intimate Partner Violence: Not on file     Family History  Problem Relation Age of Onset   Hypertension Mother    Alcohol abuse Father      Review of Systems  Constitutional: Negative.  Negative for chills and fever.  HENT: Negative.  Negative for congestion and sore throat.   Respiratory: Negative.  Negative for cough and shortness of breath.   Cardiovascular: Negative.  Negative for chest pain and palpitations.  Gastrointestinal:  Negative for abdominal pain, diarrhea, nausea and vomiting.  Genitourinary: Negative.  Negative for dysuria and hematuria.  Skin: Negative.  Negative for rash.  Neurological: Negative.  Negative for dizziness and headaches.  All other systems reviewed and are negative.  Today's Vitals   06/13/22 1454  BP: 132/86  Pulse: 76  Temp: 98.5 F (36.9 C)  TempSrc: Oral  SpO2: 98%  Weight: 266 lb 2 oz (120.7 kg)  Height: '5\' 9"'$  (1.753 m)   Body mass index is 39.3 kg/m.   Physical Exam Vitals reviewed.  Constitutional:      Appearance: Normal appearance.  HENT:     Head: Normocephalic.  Eyes:     Extraocular Movements: Extraocular movements intact.  Cardiovascular:     Rate and Rhythm: Normal rate.  Pulmonary:     Effort: Pulmonary effort is normal.  Skin:    General: Skin is warm and dry.  Capillary Refill: Capillary refill takes less than 2 seconds.  Neurological:     General: No focal deficit present.     Mental Status: He is alert and oriented to person, place, and time.  Psychiatric:        Mood and Affect: Mood normal.        Behavior: Behavior normal.      ASSESSMENT & PLAN: A total of 42 minutes was spent with the patient and counseling/coordination of care regarding preparing for this visit, review of most recent office visit notes, review of chronic medical conditions under management, education on nutrition, review of all medications, need for blood work today, prognosis, documentation, and need for follow-up.  Problem List Items Addressed This Visit        Cardiovascular and Mediastinum   HTN (hypertension)    Well-controlled off medication. BP Readings from Last 3 Encounters:  06/13/22 132/86  03/27/22 134/80  09/12/21 136/78        Relevant Orders   CBC with Differential/Platelet   Comprehensive metabolic panel   Hemoglobin A1c   Lipid panel     Other   Body mass index (BMI) of 39.0-39.9 in adult - Primary    Diet and nutrition discussed. Advised to decrease amount of daily carbohydrate intake and daily calories and increase amount of plant-based protein in his diet. Benefits of exercise discussed in particular strength training. Blood work done today. Will benefit from GLP-1 agonist.      Relevant Orders   CBC with Differential/Platelet   Comprehensive metabolic panel   Hemoglobin A1c   Lipid panel   Prediabetes    Diet and nutrition discussed.  Cardiovascular risks associated with prediabetes discussed.      Relevant Orders   CBC with Differential/Platelet   Comprehensive metabolic panel   Hemoglobin A1c   Lipid panel   Patient Instructions  Calorie Counting for Weight Loss Calories are units of energy. Your body needs a certain number of calories from food to keep going throughout the day. When you eat or drink more calories than your body needs, your body stores the extra calories mostly as fat. When you eat or drink fewer calories than your body needs, your body burns fat to get the energy it needs. Calorie counting means keeping track of how many calories you eat and drink each day. Calorie counting can be helpful if you need to lose weight. If you eat fewer calories than your body needs, you should lose weight. Ask your health care provider what a healthy weight is for you. For calorie counting to work, you will need to eat the right number of calories each day to lose a healthy amount of weight per week. A dietitian can help you figure out how many calories you need in a day and will suggest ways to reach your  calorie goal. A healthy amount of weight to lose each week is usually 1-2 lb (0.5-0.9 kg). This usually means that your daily calorie intake should be reduced by 500-750 calories. Eating 1,200-1,500 calories a day can help most women lose weight. Eating 1,500-1,800 calories a day can help most men lose weight. What do I need to know about calorie counting? Work with your health care provider or dietitian to determine how many calories you should get each day. To meet your daily calorie goal, you will need to: Find out how many calories are in each food that you would like to eat. Try to do this before you eat.  Decide how much of the food you plan to eat. Keep a food log. Do this by writing down what you ate and how many calories it had. To successfully lose weight, it is important to balance calorie counting with a healthy lifestyle that includes regular activity. Where do I find calorie information?  The number of calories in a food can be found on a Nutrition Facts label. If a food does not have a Nutrition Facts label, try to look up the calories online or ask your dietitian for help. Remember that calories are listed per serving. If you choose to have more than one serving of a food, you will have to multiply the calories per serving by the number of servings you plan to eat. For example, the label on a package of bread might say that a serving size is 1 slice and that there are 90 calories in a serving. If you eat 1 slice, you will have eaten 90 calories. If you eat 2 slices, you will have eaten 180 calories. How do I keep a food log? After each time that you eat, record the following in your food log as soon as possible: What you ate. Be sure to include toppings, sauces, and other extras on the food. How much you ate. This can be measured in cups, ounces, or number of items. How many calories were in each food and drink. The total number of calories in the food you ate. Keep your food log  near you, such as in a pocket-sized notebook or on an app or website on your mobile phone. Some programs will calculate calories for you and show you how many calories you have left to meet your daily goal. What are some portion-control tips? Know how many calories are in a serving. This will help you know how many servings you can have of a certain food. Use a measuring cup to measure serving sizes. You could also try weighing out portions on a kitchen scale. With time, you will be able to estimate serving sizes for some foods. Take time to put servings of different foods on your favorite plates or in your favorite bowls and cups so you know what a serving looks like. Try not to eat straight from a food's packaging, such as from a bag or box. Eating straight from the package makes it hard to see how much you are eating and can lead to overeating. Put the amount you would like to eat in a cup or on a plate to make sure you are eating the right portion. Use smaller plates, glasses, and bowls for smaller portions and to prevent overeating. Try not to multitask. For example, avoid watching TV or using your computer while eating. If it is time to eat, sit down at a table and enjoy your food. This will help you recognize when you are full. It will also help you be more mindful of what and how much you are eating. What are tips for following this plan? Reading food labels Check the calorie count compared with the serving size. The serving size may be smaller than what you are used to eating. Check the source of the calories. Try to choose foods that are high in protein, fiber, and vitamins, and low in saturated fat, trans fat, and sodium. Shopping Read nutrition labels while you shop. This will help you make healthy decisions about which foods to buy. Pay attention to nutrition labels for low-fat or fat-free foods. These foods  sometimes have the same number of calories or more calories than the full-fat  versions. They also often have added sugar, starch, or salt to make up for flavor that was removed with the fat. Make a grocery list of lower-calorie foods and stick to it. Cooking Try to cook your favorite foods in a healthier way. For example, try baking instead of frying. Use low-fat dairy products. Meal planning Use more fruits and vegetables. One-half of your plate should be fruits and vegetables. Include lean proteins, such as chicken, [place], and fish. Lifestyle Each week, aim to do one of the following: 150 minutes of moderate exercise, such as walking. 75 minutes of vigorous exercise, such as running. General information Know how many calories are in the foods you eat most often. This will help you calculate calorie counts faster. Find a way of tracking calories that works for you. Get creative. Try different apps or programs if writing down calories does not work for you. What foods should I eat?  Eat nutritious foods. It is better to have a nutritious, high-calorie food, such as an avocado, than a food with few nutrients, such as a bag of potato chips. Use your calories on foods and drinks that will fill you up and will not leave you hungry soon after eating. Examples of foods that fill you up are nuts and nut butters, vegetables, lean proteins, and high-fiber foods such as whole grains. High-fiber foods are foods with more than 5 g of fiber per serving. Pay attention to calories in drinks. Low-calorie drinks include water and unsweetened drinks. The items listed above may not be a complete list of foods and beverages you can eat. Contact a dietitian for more information. What foods should I limit? Limit foods or drinks that are not good sources of vitamins, minerals, or protein or that are high in unhealthy fats. These include: Candy. Other sweets. Sodas, specialty coffee drinks, alcohol, and juice. The items listed above may not be a complete list of foods and beverages you  should avoid. Contact a dietitian for more information. How do I count calories when eating out? Pay attention to portions. Often, portions are much larger when eating out. Try these tips to keep portions smaller: Consider sharing a meal instead of getting your own. If you get your own meal, eat only half of it. Before you start eating, ask for a container and put half of your meal into it. When available, consider ordering smaller portions from the menu instead of full portions. Pay attention to your food and drink choices. Knowing the way food is cooked and what is included with the meal can help you eat fewer calories. If calories are listed on the menu, choose the lower-calorie options. Choose dishes that include vegetables, fruits, whole grains, low-fat dairy products, and lean proteins. Choose items that are boiled, broiled, grilled, or steamed. Avoid items that are buttered, battered, fried, or served with cream sauce. Items labeled as crispy are usually fried, unless stated otherwise. Choose water, low-fat milk, unsweetened iced tea, or other drinks without added sugar. If you want an alcoholic beverage, choose a lower-calorie option, such as a glass of wine or light beer. Ask for dressings, sauces, and syrups on the side. These are usually high in calories, so you should limit the amount you eat. If you want a salad, choose a garden salad and ask for grilled meats. Avoid extra toppings such as bacon, cheese, or fried items. Ask for the dressing on the  side, or ask for olive oil and vinegar or lemon to use as dressing. Estimate how many servings of a food you are given. Knowing serving sizes will help you be aware of how much food you are eating at restaurants. Where to find more information Centers for Disease Control and Prevention: http://www.wolf.info/ U.S. Department of Agriculture: http://www.wilson-mendoza.org/ Summary Calorie counting means keeping track of how many calories you eat and drink each day. If you  eat fewer calories than your body needs, you should lose weight. A healthy amount of weight to lose per week is usually 1-2 lb (0.5-0.9 kg). This usually means reducing your daily calorie intake by 500-750 calories. The number of calories in a food can be found on a Nutrition Facts label. If a food does not have a Nutrition Facts label, try to look up the calories online or ask your dietitian for help. Use smaller plates, glasses, and bowls for smaller portions and to prevent overeating. Use your calories on foods and drinks that will fill you up and not leave you hungry shortly after a meal. This information is not intended to replace advice given to you by your health care provider. Make sure you discuss any questions you have with your health care provider. Document Revised: 06/17/2019 Document Reviewed: 06/17/2019 Elsevier Patient Education  [place], MD [facility]

## 2022-06-13 NOTE — Patient Instructions (Signed)
Calorie Counting for Weight Loss Calories are units of energy. Your body needs a certain number of calories from food to keep going throughout the day. When you eat or drink more calories than your body needs, your body stores the extra calories mostly as fat. When you eat or drink fewer calories than your body needs, your body burns fat to get the energy it needs. Calorie counting means keeping track of how many calories you eat and drink each day. Calorie counting can be helpful if you need to lose weight. If you eat fewer calories than your body needs, you should lose weight. Ask your health care provider what a healthy weight is for you. For calorie counting to work, you will need to eat the right number of calories each day to lose a healthy amount of weight per week. A dietitian can help you figure out how many calories you need in a day and will suggest ways to reach your calorie goal. A healthy amount of weight to lose each week is usually 1-2 lb (0.5-0.9 kg). This usually means that your daily calorie intake should be reduced by 500-750 calories. Eating 1,200-1,500 calories a day can help most women lose weight. Eating 1,500-1,800 calories a day can help most men lose weight. What do I need to know about calorie counting? Work with your health care provider or dietitian to determine how many calories you should get each day. To meet your daily calorie goal, you will need to: Find out how many calories are in each food that you would like to eat. Try to do this before you eat. Decide how much of the food you plan to eat. Keep a food log. Do this by writing down what you ate and how many calories it had. To successfully lose weight, it is important to balance calorie counting with a healthy lifestyle that includes regular activity. Where do I find calorie information?  The number of calories in a food can be found on a Nutrition Facts label. If a food does not have a Nutrition Facts label, try  to look up the calories online or ask your dietitian for help. Remember that calories are listed per serving. If you choose to have more than one serving of a food, you will have to multiply the calories per serving by the number of servings you plan to eat. For example, the label on a package of bread might say that a serving size is 1 slice and that there are 90 calories in a serving. If you eat 1 slice, you will have eaten 90 calories. If you eat 2 slices, you will have eaten 180 calories. How do I keep a food log? After each time that you eat, record the following in your food log as soon as possible: What you ate. Be sure to include toppings, sauces, and other extras on the food. How much you ate. This can be measured in cups, ounces, or number of items. How many calories were in each food and drink. The total number of calories in the food you ate. Keep your food log near you, such as in a pocket-sized notebook or on an app or website on your mobile phone. Some programs will calculate calories for you and show you how many calories you have left to meet your daily goal. What are some portion-control tips? Know how many calories are in a serving. This will help you know how many servings you can have of a certain   food. Use a measuring cup to measure serving sizes. You could also try weighing out portions on a kitchen scale. With time, you will be able to estimate serving sizes for some foods. Take time to put servings of different foods on your favorite plates or in your favorite bowls and cups so you know what a serving looks like. Try not to eat straight from a food's packaging, such as from a bag or box. Eating straight from the package makes it hard to see how much you are eating and can lead to overeating. Put the amount you would like to eat in a cup or on a plate to make sure you are eating the right portion. Use smaller plates, glasses, and bowls for smaller portions and to prevent  overeating. Try not to multitask. For example, avoid watching TV or using your computer while eating. If it is time to eat, sit down at a table and enjoy your food. This will help you recognize when you are full. It will also help you be more mindful of what and how much you are eating. What are tips for following this plan? Reading food labels Check the calorie count compared with the serving size. The serving size may be smaller than what you are used to eating. Check the source of the calories. Try to choose foods that are high in protein, fiber, and vitamins, and low in saturated fat, trans fat, and sodium. Shopping Read nutrition labels while you shop. This will help you make healthy decisions about which foods to buy. Pay attention to nutrition labels for low-fat or fat-free foods. These foods sometimes have the same number of calories or more calories than the full-fat versions. They also often have added sugar, starch, or salt to make up for flavor that was removed with the fat. Make a grocery list of lower-calorie foods and stick to it. Cooking Try to cook your favorite foods in a healthier way. For example, try baking instead of frying. Use low-fat dairy products. Meal planning Use more fruits and vegetables. One-half of your plate should be fruits and vegetables. Include lean proteins, such as chicken, turkey, and fish. Lifestyle Each week, aim to do one of the following: 150 minutes of moderate exercise, such as walking. 75 minutes of vigorous exercise, such as running. General information Know how many calories are in the foods you eat most often. This will help you calculate calorie counts faster. Find a way of tracking calories that works for you. Get creative. Try different apps or programs if writing down calories does not work for you. What foods should I eat?  Eat nutritious foods. It is better to have a nutritious, high-calorie food, such as an avocado, than a food with  few nutrients, such as a bag of potato chips. Use your calories on foods and drinks that will fill you up and will not leave you hungry soon after eating. Examples of foods that fill you up are nuts and nut butters, vegetables, lean proteins, and high-fiber foods such as whole grains. High-fiber foods are foods with more than 5 g of fiber per serving. Pay attention to calories in drinks. Low-calorie drinks include water and unsweetened drinks. The items listed above may not be a complete list of foods and beverages you can eat. Contact a dietitian for more information. What foods should I limit? Limit foods or drinks that are not good sources of vitamins, minerals, or protein or that are high in unhealthy fats. These   include: Candy. Other sweets. Sodas, specialty coffee drinks, alcohol, and juice. The items listed above may not be a complete list of foods and beverages you should avoid. Contact a dietitian for more information. How do I count calories when eating out? Pay attention to portions. Often, portions are much larger when eating out. Try these tips to keep portions smaller: Consider sharing a meal instead of getting your own. If you get your own meal, eat only half of it. Before you start eating, ask for a container and put half of your meal into it. When available, consider ordering smaller portions from the menu instead of full portions. Pay attention to your food and drink choices. Knowing the way food is cooked and what is included with the meal can help you eat fewer calories. If calories are listed on the menu, choose the lower-calorie options. Choose dishes that include vegetables, fruits, whole grains, low-fat dairy products, and lean proteins. Choose items that are boiled, broiled, grilled, or steamed. Avoid items that are buttered, battered, fried, or served with cream sauce. Items labeled as crispy are usually fried, unless stated otherwise. Choose water, low-fat milk,  unsweetened iced tea, or other drinks without added sugar. If you want an alcoholic beverage, choose a lower-calorie option, such as a glass of wine or light beer. Ask for dressings, sauces, and syrups on the side. These are usually high in calories, so you should limit the amount you eat. If you want a salad, choose a garden salad and ask for grilled meats. Avoid extra toppings such as bacon, cheese, or fried items. Ask for the dressing on the side, or ask for olive oil and vinegar or lemon to use as dressing. Estimate how many servings of a food you are given. Knowing serving sizes will help you be aware of how much food you are eating at restaurants. Where to find more information Centers for Disease Control and Prevention: www.cdc.gov U.S. Department of Agriculture: myplate.gov Summary Calorie counting means keeping track of how many calories you eat and drink each day. If you eat fewer calories than your body needs, you should lose weight. A healthy amount of weight to lose per week is usually 1-2 lb (0.5-0.9 kg). This usually means reducing your daily calorie intake by 500-750 calories. The number of calories in a food can be found on a Nutrition Facts label. If a food does not have a Nutrition Facts label, try to look up the calories online or ask your dietitian for help. Use smaller plates, glasses, and bowls for smaller portions and to prevent overeating. Use your calories on foods and drinks that will fill you up and not leave you hungry shortly after a meal. This information is not intended to replace advice given to you by your health care provider. Make sure you discuss any questions you have with your health care provider. Document Revised: 06/17/2019 Document Reviewed: 06/17/2019 Elsevier Patient Education  2023 [employer]  

## 2022-06-13 NOTE — Assessment & Plan Note (Signed)
Diet and nutrition discussed. Advised to decrease amount of daily carbohydrate intake and daily calories and increase amount of plant-based protein in his diet. Benefits of exercise discussed in particular strength training. Blood work done today. Will benefit from GLP-1 agonist.

## 2022-06-13 NOTE — Assessment & Plan Note (Signed)
Diet and nutrition discussed.  Cardiovascular risks associated with prediabetes discussed.

## 2022-06-14 ENCOUNTER — Other Ambulatory Visit: Payer: Self-pay | Admitting: Emergency Medicine

## 2022-06-14 DIAGNOSIS — Z6839 Body mass index (BMI) 39.0-39.9, adult: Secondary | ICD-10-CM

## 2022-06-14 MED ORDER — WEGOVY 0.5 MG/0.5ML ~~LOC~~ SOAJ: 0.5000 mg | SUBCUTANEOUS | 1 refills | Status: DC

## 2022-06-17 ENCOUNTER — Telehealth: Payer: Self-pay | Admitting: *Deleted

## 2022-06-17 NOTE — Telephone Encounter (Signed)
PA for [facility] submitted, awaiting response Key: [id number]

## 2022-06-18 NOTE — Telephone Encounter (Signed)
Will need pharmacy recommendations

## 2022-06-18 NOTE — Telephone Encounter (Signed)
Patient called he spoke with pharmacy they stated the [facility] is on back order and if something else could be prescribed, either a higher dosage or a different medication all together.

## 2022-06-19 ENCOUNTER — Telehealth: Payer: Self-pay | Admitting: *Deleted

## 2022-06-19 ENCOUNTER — Telehealth: Payer: Self-pay | Admitting: Emergency Medicine

## 2022-06-19 MED ORDER — SEMAGLUTIDE (1 MG/DOSE) 4 MG/3ML ~~LOC~~ SOPN: 1.0000 mg | PEN_INJECTOR | SUBCUTANEOUS | 3 refills | Status: DC

## 2022-06-19 NOTE — Telephone Encounter (Signed)
Okay to use Ozempic 1 mg

## 2022-06-19 NOTE — Telephone Encounter (Signed)
PA for Ozempic submitted, awaiting response Key: [id number]

## 2022-06-19 NOTE — Telephone Encounter (Signed)
New prescription sent to patient pharmacy  

## 2022-06-19 NOTE — Addendum Note (Signed)
Addended by: Rae Mar on: 06/19/2022 11:36 AM   Modules accepted: Orders

## 2022-06-19 NOTE — Telephone Encounter (Signed)
Pt called stated that Ozempic '1mg'$  will be covered by his insurance. Pt also stated he would need pre authorization . Call back: [phone]

## 2022-06-21 NOTE — Telephone Encounter (Signed)
Pt was informed that PA for ozempic was denied. Pt states he is open to trying an alternative and knows that Kirke Shaggy was on the list of meds covered for weight loss. He is asking can rx be sent in for the Saxenda

## 2022-06-21 NOTE — Telephone Encounter (Signed)
PA for Ozempic denied, patient aware

## 2022-06-23 ENCOUNTER — Other Ambulatory Visit: Payer: Self-pay | Admitting: Emergency Medicine

## 2022-06-23 MED ORDER — SAXENDA 18 MG/3ML ~~LOC~~ SOPN: 0.6000 mg | PEN_INJECTOR | Freq: Every day | SUBCUTANEOUS | 3 refills | Status: DC

## 2022-06-23 NOTE — Telephone Encounter (Signed)
New prescription for Saxenda sent to pharmacy of record.  Thanks.

## 2022-06-24 ENCOUNTER — Telehealth: Payer: Self-pay | Admitting: *Deleted

## 2022-06-24 NOTE — Telephone Encounter (Signed)
PA for Saxenda submitted, awaiting response Key: [id number]

## 2022-06-24 NOTE — Telephone Encounter (Unsigned)
Pharmacy Patient Advocate Encounter  Received notification from [facility] that the request for prior authorization for Saxenda has been denied due to ***.    Please be advised we currently do not have a Pharmacist to review denials, therefore you will need to process appeals accordingly as needed. Thanks for your support at this time.

## 2022-06-25 ENCOUNTER — Other Ambulatory Visit: Payer: Self-pay | Admitting: Emergency Medicine

## 2022-06-25 MED ORDER — ZEPBOUND 2.5 MG/0.5ML ~~LOC~~ SOAJ: 2.5000 mg | SUBCUTANEOUS | 3 refills | Status: DC

## 2022-06-25 NOTE — Telephone Encounter (Signed)
New prescription for Zenpep sent to pharmacy of record today.

## 2022-06-25 NOTE — Telephone Encounter (Signed)
Electronic appeal has been submitted, awaiting response, patient notified

## 2022-06-26 ENCOUNTER — Telehealth: Payer: Self-pay | Admitting: *Deleted

## 2022-06-26 NOTE — Telephone Encounter (Signed)
PA Zepbound submitted, awaiting response Key: [id number]

## 2022-06-28 NOTE — Telephone Encounter (Signed)
PA Zepbound denied, awaiting on denial information to be faxed to the office

## 2022-07-11 ENCOUNTER — Ambulatory Visit: Payer: BC Managed Care – PPO | Admitting: Podiatry

## 2022-08-02 DIAGNOSIS — H11441 Conjunctival cysts, right eye: Secondary | ICD-10-CM | POA: Diagnosis not present

## 2022-08-07 ENCOUNTER — Telehealth: Payer: Self-pay | Admitting: Emergency Medicine

## 2022-08-07 NOTE — Telephone Encounter (Signed)
What medication is he referring to?  Again, Wegovy does not come in 1.5 or 2.0 doses.  Thanks.

## 2022-08-07 NOTE — Telephone Encounter (Signed)
Clarify doses please.  Wegovy does not come in 1.5 and 2.0.  Thanks.

## 2022-08-07 NOTE — Telephone Encounter (Signed)
Called pt he states he never did start on the dose MD started him on due to the fact that med was on back order. [facility] on [address] has 1.5 and the 2 mg in that that can give him, but he need rx sent to them on which one he can take.Marland KitchenJohny Chess

## 2022-08-07 NOTE — Telephone Encounter (Signed)
Patient has found wegovy 1.5 and the 2.0 at the [address] [facility].  Patient would like to know if a rx can be sent in there while they still have it.  Please call patient and advise:  [phone]

## 2022-08-08 MED ORDER — WEGOVY 1.7 MG/0.75ML ~~LOC~~ SOAJ: 1.7000 mg | SUBCUTANEOUS | 0 refills | Status: DC

## 2022-08-08 NOTE — Telephone Encounter (Signed)
Okay to use Wegovy 1.7 mg.  Thanks.

## 2022-08-08 NOTE — Telephone Encounter (Signed)
Called [facility] spoke w/ tech she states they have Wegovy 1mg , 1.7mg  and 2.4mg ..Charles Dickson

## 2022-08-08 NOTE — Telephone Encounter (Signed)
Notified pt w/MD response. Sent rx to [place].Marland KitchenJohny Dickson

## 2022-08-13 ENCOUNTER — Telehealth: Payer: Self-pay | Admitting: *Deleted

## 2022-08-13 ENCOUNTER — Telehealth: Payer: Self-pay | Admitting: Emergency Medicine

## 2022-08-13 DIAGNOSIS — H538 Other visual disturbances: Secondary | ICD-10-CM | POA: Diagnosis not present

## 2022-08-13 DIAGNOSIS — H532 Diplopia: Secondary | ICD-10-CM | POA: Diagnosis not present

## 2022-08-13 DIAGNOSIS — H0520 Unspecified exophthalmos: Secondary | ICD-10-CM | POA: Diagnosis not present

## 2022-08-13 DIAGNOSIS — H05211 Displacement (lateral) of globe, right eye: Secondary | ICD-10-CM | POA: Diagnosis not present

## 2022-08-13 DIAGNOSIS — H11421 Conjunctival edema, right eye: Secondary | ICD-10-CM | POA: Diagnosis not present

## 2022-08-13 DIAGNOSIS — H4710 Unspecified papilledema: Secondary | ICD-10-CM | POA: Diagnosis not present

## 2022-08-13 DIAGNOSIS — H05011 Cellulitis of right orbit: Secondary | ICD-10-CM | POA: Diagnosis not present

## 2022-08-13 DIAGNOSIS — H469 Unspecified optic neuritis: Secondary | ICD-10-CM | POA: Diagnosis not present

## 2022-08-13 NOTE — Telephone Encounter (Signed)
Received a call from an ophthalmologist in [facility], patient is been seen for an emergent ocular case, patient going to be sent to the ED there for CT scans and other test, poss Graves disease, right eye vision loss. I mentioned to the wife to fax over [place] notes and scans as we have no access to their system. Patient has an appt on 08/19/2022

## 2022-08-13 NOTE — Telephone Encounter (Signed)
Thanks

## 2022-08-15 LAB — AMB REFERRAL TO OPHTHALMOLOGY

## 2022-08-16 DIAGNOSIS — H538 Other visual disturbances: Secondary | ICD-10-CM | POA: Diagnosis not present

## 2022-08-16 DIAGNOSIS — H11421 Conjunctival edema, right eye: Secondary | ICD-10-CM | POA: Diagnosis not present

## 2022-08-16 DIAGNOSIS — H0520 Unspecified exophthalmos: Secondary | ICD-10-CM | POA: Diagnosis not present

## 2022-08-16 DIAGNOSIS — H4710 Unspecified papilledema: Secondary | ICD-10-CM | POA: Diagnosis not present

## 2022-08-19 ENCOUNTER — Ambulatory Visit: Payer: BC Managed Care – PPO | Admitting: Emergency Medicine

## 2022-08-19 ENCOUNTER — Encounter: Payer: Self-pay | Admitting: Emergency Medicine

## 2022-08-19 VITALS — BP 136/84 | HR 65 | Temp 97.9°F | Ht 69.0 in | Wt 267.5 lb

## 2022-08-19 DIAGNOSIS — I10 Essential (primary) hypertension: Secondary | ICD-10-CM | POA: Diagnosis not present

## 2022-08-19 DIAGNOSIS — H05011 Cellulitis of right orbit: Secondary | ICD-10-CM | POA: Diagnosis not present

## 2022-08-19 NOTE — Progress Notes (Signed)
Charles Dickson 52 y.o.   Chief Complaint  Patient presents with   Medical Management of Chronic Issues    F/u appt from ED in [facility], patient was seen for an emergent ocular issues, right eye,  patient was in the hosp for 2 days, patient is on steroid and ABX,   Urgent referral to ophthalmology     HISTORY OF PRESENT ILLNESS: This is a 52 y.o. 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  Much improved Discharge summary from [facility] in [place] reviewed.  HPI   Prior to Admission medications   Medication Sig Start Date End Date Taking? Authorizing Provider  amoxicillin-clavulanate (AUGMENTIN) 875-125 MG tablet SMARTSIG:1 Tablet(s) By Mouth Every 12 Hours 08/15/22  Yes [provider]  pantoprazole (PROTONIX) 40 MG tablet Take 40 mg by mouth daily. 08/15/22  Yes [provider]  predniSONE (DELTASONE) 20 MG tablet Take 80 mg by mouth daily. 08/15/22  Yes [provider]  Semaglutide-Weight Management (WEGOVY) 1.7 MG/0.75ML SOAJ Inject 1.7 mg into the skin once a week. Patient not taking: Reported on 08/19/2022 08/08/22   Horald Pollen, MD  sildenafil (VIAGRA) 100 MG tablet Take 0.5-1 tablets (50-100 mg total) by mouth daily as needed for erectile dysfunction. 03/27/22  Yes Iyahna Obriant, Ines Bloomer, MD  tirzepatide [facility]) 2.5 MG/0.5ML Pen Inject 2.5 mg into the skin once a week. 06/25/22  Yes Horald Pollen, MD    No Known Allergies  Patient Active Problem List   Diagnosis Date Noted   Body mass index (BMI) of 39.0-39.9 in adult 06/13/2022   Prediabetes 06/13/2022   Onychomycosis 03/27/2022   Erectile dysfunction due to arterial insufficiency 03/27/2022   HTN (hypertension) 06/17/2015    Past Medical History:  Diagnosis Date   Asthma     History of stomach ulcers    Hypertension     Past Surgical History:  Procedure Laterality Date   INGUINAL HERNIA REPAIR Left 2014    Social History   Socioeconomic History   Marital status: Married    Spouse name: Not on file   Number of children: Not on file   Years of education: Not on file   Highest education level: Not on file  Occupational History   Not on file  Tobacco Use   Smoking status: Never   Smokeless tobacco: Never  Vaping Use   Vaping Use: Never used  Substance and Sexual Activity   Alcohol use: No    Comment: former   Drug use: No   Sexual activity: Not on file  Other Topics Concern   Not on file  Social History Narrative   Not on file   Social Determinants of Health   Financial Resource Strain: Not on file  Food Insecurity: Not on file  Transportation Needs: Not on file  Physical Activity: Not on file  Stress: Not on file  Social Connections: Not on file  Intimate Partner Violence: Not on file    Family History  Problem Relation Age of Onset   Hypertension Mother    Alcohol abuse Father      Review of Systems  Constitutional: Negative.  Negative for chills and fever.  HENT: Negative.  Negative for congestion and sore throat.   Respiratory: Negative.  Negative for cough and shortness of breath.   Cardiovascular: Negative.  Negative for chest  pain and palpitations.  Gastrointestinal:  Negative for abdominal pain, diarrhea, nausea and vomiting.  Skin: Negative.  Negative for rash.  Neurological: Negative.  Negative for dizziness and headaches.  All other systems reviewed and are negative.   Vitals:   08/19/22 0945  BP: 136/84  Pulse: 65  Temp: 97.9 F (36.6 C)  SpO2: 98%    Physical Exam Vitals reviewed.  Constitutional:      Appearance: Normal appearance.  HENT:     Head: Normocephalic.     Mouth/Throat:     Mouth: Mucous membranes are moist.     Pharynx: Oropharynx is clear.  Eyes:     Extraocular Movements:  Extraocular movements intact.     Conjunctiva/sclera: Conjunctivae normal.     Pupils: Pupils are equal, round, and reactive to light.  Cardiovascular:     Rate and Rhythm: Normal rate.  Pulmonary:     Effort: Pulmonary effort is normal.  Musculoskeletal:     Cervical back: No tenderness.  Lymphadenopathy:     Cervical: No cervical adenopathy.  Skin:    General: Skin is warm and dry.     Capillary Refill: Capillary refill takes less than 2 seconds.  Neurological:     General: No focal deficit present.     Mental Status: He is alert and oriented to person, place, and time.     Cranial Nerves: No cranial nerve deficit.  Psychiatric:        Mood and Affect: Mood normal.        Behavior: Behavior normal.      ASSESSMENT & PLAN: A total of 34 minutes was spent with the patient and counseling/coordination of care regarding preparing for this visit, review of most recent hospital discharge summary notes, review of most recent ophthalmology office visit notes, review of all medications, diagnosis and treatment of orbital cellulitis, need for ophthalmology follow-up, prognosis, documentation and need for follow-up. Patient was scheduled to see ophthalmologist this afternoon.  Problem List Items Addressed This Visit       Cardiovascular and Mediastinum   HTN (hypertension)    Well-controlled hypertension Off medications BP Readings from Last 3 Encounters:  08/19/22 136/84  06/13/22 132/86  03/27/22 134/80          Other   Orbital cellulitis on right - Primary    Much improved. Continue Augmentin 875 twice a day Continue prednisone 80 mg daily Needs ophthalmology follow-up Urgent referral placed today      Relevant Orders   Ambulatory referral to Ophthalmology   Patient Instructions  Orbital Cellulitis Orbital cellulitis is an infection in the eye socket (orbit) and the tissues that surround the eye. The infection can spread to the eyelids, eyebrow area, and cheek. It  can also cause a pocket of pus to develop around the eye (orbital abscess). In severe cases, the infection can spread to the brain. Orbital cellulitis is a medical emergency. What are the causes? The most common cause of this condition is a bacterial infection. The infection usually spreads to the eye socket from another part of the body. The infection may start in the: Nose or sinuses. Eyelids. Facial skin. Bloodstream. What increases the risk? You are more likely to develop this condition if you recently had one of the following: Upper respiratory infection. This affects the nose, throat, and upper air passages. Sinus infection. Eyelid or facial infection. Tooth infection. Eye injury or an object behind the eyeball that should not be there (foreign body). Infection that affects  the entire body or the bloodstream (systemic infection). What are the signs or symptoms? Symptoms of this condition include: Eye pain that gets worse with eye movement. Swelling around the eye. Eye redness. Bulging of the eye. Inability to move the eye. Double vision or decreased vision. Fever. Symptoms of this condition usually start quickly. How is this diagnosed? This condition may be diagnosed based on your symptoms and an eye exam. You may also have tests to confirm the diagnosis and to check for an orbital abscess. Other tests (cultures) may be done to find out which specific bacteria are causing the infection. Tests may include: Complete blood count (CBC). Blood culture. Nose, sinus, or throat culture. Imaging studies, such as a CT scan. Less commonly, you may also have an MRI. How is this treated? This condition is usually treated with antibiotic medicines in a hospital. Antibiotics are given directly into a vein through an IV. At first, you may get IV antibiotics to kill bacteria that often cause orbital cellulitis (broad spectrum antibiotics). Your medicine may be changed if the culture test  results suggest that another medicine would be better. If the IV antibiotics are working to treat your infection, you may be switched to oral antibiotics and allowed to go home. In some cases, surgery may be needed to release pressure from behind the eye or to drain an orbital abscess. Follow these instructions at home: Take over-the-counter and prescription medicines only as told by your health care provider. Take your antibiotic medicine as told by your health care provider. Do not stop taking the antibiotic even if you start to feel better. Return to your normal activities as told by your health care provider. Ask your health care provider what activities are safe for you. Keep all follow-up visits. This is important. Contact a health care provider if: You have questions about your medicines. Your pain is not well-controlled. Get help right away if: Your eye pain or swelling returns or it gets worse. You have any changes in your vision. You develop a fever. You have vomiting. You develop a severe headache or numbness in your face. These symptoms may represent a serious problem that is an emergency. Do not wait to see if the symptoms will go away. Get medical help right away. Call your local emergency services (911 in the [place] Do not drive yourself to the hospital. Summary Orbital cellulitis is an infection in the eye socket (orbit) and the tissues that surround the eye. The infection can spread to other areas. Symptoms usually start quickly. Some of the symptoms include eye pain that gets worse with movement, redness and swelling around the eye, double vision, or decreased vision. Orbital cellulitis is a medical emergency, and it requires IV antibiotics for treatment. Get help right away if your symptoms return or get worse. This information is not intended to replace advice given to you by your health care provider. Make sure you discuss any questions you have with your health care  provider. Document Revised: 09/08/2019 Document Reviewed: 09/08/2019 Elsevier Patient Education  [place], MD [facility]

## 2022-08-19 NOTE — Assessment & Plan Note (Signed)
Much improved. Continue Augmentin 875 twice a day Continue prednisone 80 mg daily Needs ophthalmology follow-up Urgent referral placed today

## 2022-08-19 NOTE — Assessment & Plan Note (Signed)
Well-controlled hypertension Off medications BP Readings from Last 3 Encounters:  08/19/22 136/84  06/13/22 132/86  03/27/22 134/80

## 2022-08-19 NOTE — Patient Instructions (Signed)
Orbital Cellulitis Orbital cellulitis is an infection in the eye socket (orbit) and the tissues that surround the eye. The infection can spread to the eyelids, eyebrow area, and cheek. It can also cause a pocket of pus to develop around the eye (orbital abscess). In severe cases, the infection can spread to the brain. Orbital cellulitis is a medical emergency. What are the causes? The most common cause of this condition is a bacterial infection. The infection usually spreads to the eye socket from another part of the body. The infection may start in the: Nose or sinuses. Eyelids. Facial skin. Bloodstream. What increases the risk? You are more likely to develop this condition if you recently had one of the following: Upper respiratory infection. This affects the nose, throat, and upper air passages. Sinus infection. Eyelid or facial infection. Tooth infection. Eye injury or an object behind the eyeball that should not be there (foreign body). Infection that affects the entire body or the bloodstream (systemic infection). What are the signs or symptoms? Symptoms of this condition include: Eye pain that gets worse with eye movement. Swelling around the eye. Eye redness. Bulging of the eye. Inability to move the eye. Double vision or decreased vision. Fever. Symptoms of this condition usually start quickly. How is this diagnosed? This condition may be diagnosed based on your symptoms and an eye exam. You may also have tests to confirm the diagnosis and to check for an orbital abscess. Other tests (cultures) may be done to find out which specific bacteria are causing the infection. Tests may include: Complete blood count (CBC). Blood culture. Nose, sinus, or throat culture. Imaging studies, such as a CT scan. Less commonly, you may also have an MRI. How is this treated? This condition is usually treated with antibiotic medicines in a hospital. Antibiotics are given directly into a vein  through an IV. At first, you may get IV antibiotics to kill bacteria that often cause orbital cellulitis (broad spectrum antibiotics). Your medicine may be changed if the culture test results suggest that another medicine would be better. If the IV antibiotics are working to treat your infection, you may be switched to oral antibiotics and allowed to go home. In some cases, surgery may be needed to release pressure from behind the eye or to drain an orbital abscess. Follow these instructions at home: Take over-the-counter and prescription medicines only as told by your health care provider. Take your antibiotic medicine as told by your health care provider. Do not stop taking the antibiotic even if you start to feel better. Return to your normal activities as told by your health care provider. Ask your health care provider what activities are safe for you. Keep all follow-up visits. This is important. Contact a health care provider if: You have questions about your medicines. Your pain is not well-controlled. Get help right away if: Your eye pain or swelling returns or it gets worse. You have any changes in your vision. You develop a fever. You have vomiting. You develop a severe headache or numbness in your face. These symptoms may represent a serious problem that is an emergency. Do not wait to see if the symptoms will go away. Get medical help right away. Call your local emergency services (911 in the [place] Do not drive yourself to the hospital. Summary Orbital cellulitis is an infection in the eye socket (orbit) and the tissues that surround the eye. The infection can spread to other areas. Symptoms usually start quickly. Some of the   symptoms include eye pain that gets worse with movement, redness and swelling around the eye, double vision, or decreased vision. Orbital cellulitis is a medical emergency, and it requires IV antibiotics for treatment. Get help right away if your symptoms  return or get worse. This information is not intended to replace advice given to you by your health care provider. Make sure you discuss any questions you have with your health care provider. Document Revised: 09/08/2019 Document Reviewed: 09/08/2019 Elsevier Patient Education  2023 [employer]  

## 2022-09-16 ENCOUNTER — Encounter: Payer: BC Managed Care – PPO | Admitting: Emergency Medicine

## 2022-09-16 ENCOUNTER — Ambulatory Visit: Payer: BC Managed Care – PPO | Admitting: Emergency Medicine

## 2022-09-17 NOTE — Telephone Encounter (Signed)
Error

## 2022-09-18 ENCOUNTER — Ambulatory Visit (INDEPENDENT_AMBULATORY_CARE_PROVIDER_SITE_OTHER): Payer: BC Managed Care – PPO | Admitting: Emergency Medicine

## 2022-09-18 ENCOUNTER — Encounter: Payer: Self-pay | Admitting: Emergency Medicine

## 2022-09-18 VITALS — BP 138/78 | HR 95 | Temp 98.3°F | Ht 69.0 in | Wt 260.0 lb

## 2022-09-18 DIAGNOSIS — Z130 Encounter for screening for diseases of the blood and blood-forming organs and certain disorders involving the immune mechanism: Secondary | ICD-10-CM | POA: Diagnosis not present

## 2022-09-18 DIAGNOSIS — Z0000 Encounter for general adult medical examination without abnormal findings: Secondary | ICD-10-CM | POA: Diagnosis not present

## 2022-09-18 DIAGNOSIS — Z13220 Encounter for screening for lipoid disorders: Secondary | ICD-10-CM | POA: Diagnosis not present

## 2022-09-18 DIAGNOSIS — Z1329 Encounter for screening for other suspected endocrine disorder: Secondary | ICD-10-CM

## 2022-09-18 DIAGNOSIS — Z13228 Encounter for screening for other metabolic disorders: Secondary | ICD-10-CM

## 2022-09-18 MED ORDER — WEGOVY 1.7 MG/0.75ML ~~LOC~~ SOAJ: 1.7000 mg | SUBCUTANEOUS | 7 refills | Status: DC

## 2022-09-18 NOTE — Patient Instructions (Signed)
Health Maintenance, Male Adopting a healthy lifestyle and getting preventive care are important in promoting health and wellness. Ask your health care provider about: The right schedule for you to have regular tests and exams. Things you can do on your own to prevent diseases and keep yourself healthy. What should I know about diet, weight, and exercise? Eat a healthy diet  Eat a diet that includes plenty of vegetables, fruits, low-fat dairy products, and lean protein. Do not eat a lot of foods that are high in solid fats, added sugars, or sodium. Maintain a healthy weight Body mass index (BMI) is a measurement that can be used to identify possible weight problems. It estimates body fat based on height and weight. Your health care provider can help determine your BMI and help you achieve or maintain a healthy weight. Get regular exercise Get regular exercise. This is one of the most important things you can do for your health. Most adults should: Exercise for at least 150 minutes each week. The exercise should increase your heart rate and make you sweat (moderate-intensity exercise). Do strengthening exercises at least twice a week. This is in addition to the moderate-intensity exercise. Spend less time sitting. Even light physical activity can be beneficial. Watch cholesterol and blood lipids Have your blood tested for lipids and cholesterol at 54 years of age, then have this test every 5 years. You may need to have your cholesterol levels checked more often if: Your lipid or cholesterol levels are high. You are older than 54 years of age. You are at high risk for heart disease. What should I know about cancer screening? Many types of cancers can be detected early and may often be prevented. Depending on your health history and family history, you may need to have cancer screening at various ages. This may include screening for: Colorectal cancer. Prostate cancer. Skin cancer. Lung  cancer. What should I know about heart disease, diabetes, and high blood pressure? Blood pressure and heart disease High blood pressure causes heart disease and increases the risk of stroke. This is more likely to develop in people who have high blood pressure readings or are overweight. Talk with your health care provider about your target blood pressure readings. Have your blood pressure checked: Every 3-5 years if you are 18-39 years of age. Every year if you are 40 years old or older. If you are between the ages of 65 and 75 and are a current or former smoker, ask your health care provider if you should have a one-time screening for abdominal aortic aneurysm (AAA). Diabetes Have regular diabetes screenings. This checks your fasting blood sugar level. Have the screening done: Once every three years after age 45 if you are at a normal weight and have a low risk for diabetes. More often and at a younger age if you are overweight or have a high risk for diabetes. What should I know about preventing infection? Hepatitis B If you have a higher risk for hepatitis B, you should be screened for this virus. Talk with your health care provider to find out if you are at risk for hepatitis B infection. Hepatitis C Blood testing is recommended for: Everyone born from 1945 through 1965. Anyone with known risk factors for hepatitis C. Sexually transmitted infections (STIs) You should be screened each year for STIs, including gonorrhea and chlamydia, if: You are sexually active and are younger than 54 years of age. You are older than 54 years of age and your   health care provider tells you that you are at risk for this type of infection. Your sexual activity has changed since you were last screened, and you are at increased risk for chlamydia or gonorrhea. Ask your health care provider if you are at risk. Ask your health care provider about whether you are at high risk for HIV. Your health care provider  may recommend a prescription medicine to help prevent HIV infection. If you choose to take medicine to prevent HIV, you should first get tested for HIV. You should then be tested every 3 months for as long as you are taking the medicine. Follow these instructions at home: Alcohol use Do not drink alcohol if your health care provider tells you not to drink. If you drink alcohol: Limit how much you have to 0-2 drinks a day. Know how much alcohol is in your drink. In the [place] one drink equals one 12 oz bottle of beer (355 mL), one 5 oz glass of wine (148 mL), or one 1 oz glass of hard liquor (44 mL). Lifestyle Do not use any products that contain nicotine or tobacco. These products include cigarettes, chewing tobacco, and vaping devices, such as e-cigarettes. If you need help quitting, ask your health care provider. Do not use street drugs. Do not share needles. Ask your health care provider for help if you need support or information about quitting drugs. General instructions Schedule regular health, dental, and eye exams. Stay current with your vaccines. Tell your health care provider if: You often feel depressed. You have ever been abused or do not feel safe at home. Summary Adopting a healthy lifestyle and getting preventive care are important in promoting health and wellness. Follow your health care provider's instructions about healthy diet, exercising, and getting tested or screened for diseases. Follow your health care provider's instructions on monitoring your cholesterol and blood pressure. This information is not intended to replace advice given to you by your health care provider. Make sure you discuss any questions you have with your health care provider. Document Revised: 09/25/2020 Document Reviewed: 09/25/2020 Elsevier Patient Education  2023 Elsevier Inc.  

## 2022-09-18 NOTE — Progress Notes (Signed)
Charles Dickson 52 y.o.   Chief Complaint  Patient presents with   Annual Exam    Physical no other concerns. Pt needs refill for the next titrate for wegovy. 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 male here for annual exam. Overall doing well.  On Wegovy and losing weight. Has no complaints or any other medical concerns today. Wt Readings from Last 3 Encounters:  09/18/22 260 lb (117.9 kg)  08/19/22 267 lb 8 oz (121.3 kg)  06/13/22 266 lb 2 oz (120.7 kg)      HPI   Prior to Admission medications   Medication Sig Start Date End Date Taking? Authorizing Provider  pantoprazole (PROTONIX) 40 MG tablet Take 40 mg by mouth daily. 08/15/22  Yes [provider]  predniSONE (DELTASONE) 20 MG tablet Take 80 mg by mouth daily. 08/15/22  Yes [provider]  Semaglutide-Weight Management (WEGOVY) 1.7 MG/0.75ML SOAJ Inject 1.7 mg into the skin once a week. 08/08/22  Yes Matteus Mcnelly, Eilleen Kempf, MD  sildenafil (VIAGRA) 100 MG tablet Take 0.5-1 tablets (50-100 mg total) by mouth daily as needed for erectile dysfunction. 03/27/22  Yes Imogen Maddalena, Eilleen Kempf, MD  tirzepatide [facility] 2.5 MG/0.5ML Pen Inject 2.5 mg into the skin once a week. Patient not taking: Reported on 09/18/2022 06/25/22   Georgina Quint, MD    No Known Allergies  Patient Active Problem List   Diagnosis Date Noted   Orbital cellulitis on right 08/19/2022   Body mass index (BMI) of 39.0-39.9 in adult 06/13/2022   Prediabetes 06/13/2022   Onychomycosis 03/27/2022   Erectile dysfunction due to arterial insufficiency 03/27/2022   HTN (hypertension) 06/17/2015    Past Medical History:  Diagnosis Date   Asthma    History of stomach ulcers    Hypertension     Past Surgical History:  Procedure Laterality Date   INGUINAL HERNIA REPAIR Left 2014    Social History   Socioeconomic History   Marital status: Married    Spouse name: Not on  file   Number of children: Not on file   Years of education: Not on file   Highest education level: Not on file  Occupational History   Not on file  Tobacco Use   Smoking status: Never   Smokeless tobacco: Never  Vaping Use   Vaping Use: Never used  Substance and Sexual Activity   Alcohol use: No    Comment: former   Drug use: No   Sexual activity: Not on file  Other Topics Concern   Not on file  Social History Narrative   Not on file   Social Determinants of Health   Financial Resource Strain: Not on file  Food Insecurity: Not on file  Transportation Needs: Not on file  Physical Activity: Not on file  Stress: Not on file  Social Connections: Not on file  Intimate Partner Violence: Not on file    Family History  Problem Relation Age of Onset   Hypertension Mother    Alcohol abuse Father      Review of Systems  Constitutional: Negative.  Negative for chills and fever.  HENT: Negative.  Negative for congestion and sore throat.   Respiratory: Negative.  Negative for cough and shortness of breath.   Cardiovascular: Negative.  Negative for chest pain and palpitations.  Gastrointestinal:  Negative for abdominal pain, diarrhea, nausea and vomiting.  Genitourinary: Negative.  Negative  for dysuria and hematuria.  Skin: Negative.  Negative for rash.  Neurological: Negative.  Negative for dizziness and headaches.  All other systems reviewed and are negative.   Vitals:   09/18/22 1425  BP: 138/78  Pulse: 95  Temp: 98.3 F (36.8 C)  SpO2: 95%    Physical Exam Vitals reviewed.  Constitutional:      Appearance: Normal appearance.  HENT:     Head: Normocephalic.     Right Ear: Tympanic membrane, ear canal and external ear normal.     Left Ear: Tympanic membrane, ear canal and external ear normal.     Mouth/Throat:     Mouth: Mucous membranes are moist.     Pharynx: Oropharynx is clear.  Eyes:     Extraocular Movements: Extraocular movements intact.      Conjunctiva/sclera: Conjunctivae normal.     Pupils: Pupils are equal, round, and reactive to light.  Cardiovascular:     Rate and Rhythm: Normal rate and regular rhythm.     Pulses: Normal pulses.     Heart sounds: Normal heart sounds.  Pulmonary:     Effort: Pulmonary effort is normal.     Breath sounds: Normal breath sounds.  Abdominal:     Palpations: Abdomen is soft.     Tenderness: There is no abdominal tenderness.  Musculoskeletal:     Cervical back: No tenderness.     Right lower leg: No edema.     Left lower leg: No edema.  Lymphadenopathy:     Cervical: No cervical adenopathy.  Skin:    General: Skin is warm and dry.     Capillary Refill: Capillary refill takes less than 2 seconds.  Neurological:     General: No focal deficit present.     Mental Status: He is alert and oriented to person, place, and time.  Psychiatric:        Mood and Affect: Mood normal.        Behavior: Behavior normal.      ASSESSMENT & PLAN: Problem List Items Addressed This Visit   None Visit Diagnoses     Routine general medical examination at a health care facility    -  Primary   Relevant Orders   CBC with Differential   Comprehensive metabolic panel   Hemoglobin A1c   Lipid panel   Screening for deficiency anemia       Relevant Orders   CBC with Differential   Screening for lipoid disorders       Relevant Orders   Lipid panel   Screening for endocrine, metabolic and immunity disorder       Relevant Orders   Comprehensive metabolic panel   Hemoglobin A1c     Modifiable risk factors discussed with patient. Anticipatory guidance according to age provided. The following topics were also discussed: Social Determinants of Health Smoking.  Non-smoker Diet and nutrition and need to decrease amount of daily carbohydrate intake and daily calories and increase amount of plant based protein in his diet Benefits of exercise Cancer screening and review of most recent colonoscopy  report Vaccinations review and recommendations Cardiovascular risk assessment The 10-year ASCVD risk score (Arnett DK, et al., 2019) is: 7%   Values used to calculate the score:     Age: 59 years     Sex: Male     Is Non-Hispanic African American: Yes     Diabetic: No     Tobacco smoker: No     Systolic Blood Pressure: 138  mmHg     Is BP treated: No     HDL Cholesterol: 47.4 mg/dL     Total Cholesterol: 195 mg/dL  Mental health including depression and anxiety Fall and accident prevention  Patient Instructions  Health Maintenance, Male Adopting a healthy lifestyle and getting preventive care are important in promoting health and wellness. Ask your health care provider about: The right schedule for you to have regular tests and exams. Things you can do on your own to prevent diseases and keep yourself healthy. What should I know about diet, weight, and exercise? Eat a healthy diet  Eat a diet that includes plenty of vegetables, fruits, low-fat dairy products, and lean protein. Do not eat a lot of foods that are high in solid fats, added sugars, or sodium. Maintain a healthy weight Body mass index (BMI) is a measurement that can be used to identify possible weight problems. It estimates body fat based on height and weight. Your health care provider can help determine your BMI and help you achieve or maintain a healthy weight. Get regular exercise Get regular exercise. This is one of the most important things you can do for your health. Most adults should: Exercise for at least 150 minutes each week. The exercise should increase your heart rate and make you sweat (moderate-intensity exercise). Do strengthening exercises at least twice a week. This is in addition to the moderate-intensity exercise. Spend less time sitting. Even light physical activity can be beneficial. Watch cholesterol and blood lipids Have your blood tested for lipids and cholesterol at 54 years of age, then have  this test every 5 years. You may need to have your cholesterol levels checked more often if: Your lipid or cholesterol levels are high. You are older than 54 years of age. You are at high risk for heart disease. What should I know about cancer screening? Many types of cancers can be detected early and may often be prevented. Depending on your health history and family history, you may need to have cancer screening at various ages. This may include screening for: Colorectal cancer. Prostate cancer. Skin cancer. Lung cancer. What should I know about heart disease, diabetes, and high blood pressure? Blood pressure and heart disease High blood pressure causes heart disease and increases the risk of stroke. This is more likely to develop in people who have high blood pressure readings or are overweight. Talk with your health care provider about your target blood pressure readings. Have your blood pressure checked: Every 3-5 years if you are 34-52 years of age. Every year if you are 16 years old or older. If you are between the ages of 36 and 56 and are a current or former smoker, ask your health care provider if you should have a one-time screening for abdominal aortic aneurysm (AAA). Diabetes Have regular diabetes screenings. This checks your fasting blood sugar level. Have the screening done: Once every three years after age 43 if you are at a normal weight and have a low risk for diabetes. More often and at a younger age if you are overweight or have a high risk for diabetes. What should I know about preventing infection? Hepatitis B If you have a higher risk for hepatitis B, you should be screened for this virus. Talk with your health care provider to find out if you are at risk for hepatitis B infection. Hepatitis C Blood testing is recommended for: Everyone born from [birth year] through 1965. Anyone with known risk factors for hepatitis  C. Sexually transmitted infections (STIs) You should be  screened each year for STIs, including gonorrhea and chlamydia, if: You are sexually active and are younger than 53 years of age. You are older than 54 years of age and your health care provider tells you that you are at risk for this type of infection. Your sexual activity has changed since you were last screened, and you are at increased risk for chlamydia or gonorrhea. Ask your health care provider if you are at risk. Ask your health care provider about whether you are at high risk for HIV. Your health care provider may recommend a prescription medicine to help prevent HIV infection. If you choose to take medicine to prevent HIV, you should first get tested for HIV. You should then be tested every 3 months for as long as you are taking the medicine. Follow these instructions at home: Alcohol use Do not drink alcohol if your health care provider tells you not to drink. If you drink alcohol: Limit how much you have to 0-2 drinks a day. Know how much alcohol is in your drink. In the [place] one drink equals one 12 oz bottle of beer (355 mL), one 5 oz glass of wine (148 mL), or one 1 oz glass of hard liquor (44 mL). Lifestyle Do not use any products that contain nicotine or tobacco. These products include cigarettes, chewing tobacco, and vaping devices, such as e-cigarettes. If you need help quitting, ask your health care provider. Do not use street drugs. Do not share needles. Ask your health care provider for help if you need support or information about quitting drugs. General instructions Schedule regular health, dental, and eye exams. Stay current with your vaccines. Tell your health care provider if: You often feel depressed. You have ever been abused or do not feel safe at home. Summary Adopting a healthy lifestyle and getting preventive care are important in promoting health and wellness. Follow your health care provider's instructions about healthy diet, exercising, and getting tested  or screened for diseases. Follow your health care provider's instructions on monitoring your cholesterol and blood pressure. This information is not intended to replace advice given to you by your health care provider. Make sure you discuss any questions you have with your health care provider. Document Revised: 09/25/2020 Document Reviewed: 09/25/2020 Elsevier Patient Education  2023 Elsevier Inc.     Edwina Barth, MD [facility]

## 2022-09-24 ENCOUNTER — Telehealth: Payer: Self-pay | Admitting: Emergency Medicine

## 2022-09-24 NOTE — Telephone Encounter (Signed)
Previous message was routed to PCP by mistake.

## 2022-09-24 NOTE — Telephone Encounter (Signed)
Patient said [facility] is charging him significantly more than [facility]. He would like to know if it can be switched to [facility] [address]. Best callback is [phone].

## 2022-09-24 NOTE — Telephone Encounter (Signed)
Make the switch as requested please.  Thanks

## 2022-09-24 NOTE — Telephone Encounter (Signed)
Prescription Request  09/24/2022  LOV: 09/18/2022  What is the name of the medication or equipment? Semaglutide-Weight Management (WEGOVY) 1.7 MG/0.75ML   Have you contacted your pharmacy to request a refill? No   Which pharmacy would you like this sent to?       [id number] - [place], [place] - [address] AT [address] OF [facility] [address] [place] [address] Phone: [phone] Fax: [phone]  Patient notified that their request is being sent to the clinical staff for review and that they should receive a response within 2 business days.   Please advise at Mobile [phone] (mobile)

## 2022-09-24 NOTE — Telephone Encounter (Signed)
Called patient to inform patient that his rx was sent to the pharmacy on 09/18/22 with refills

## 2022-09-25 ENCOUNTER — Other Ambulatory Visit: Payer: Self-pay | Admitting: *Deleted

## 2022-09-25 ENCOUNTER — Encounter: Payer: Self-pay | Admitting: *Deleted

## 2022-09-25 MED ORDER — WEGOVY 1.7 MG/0.75ML ~~LOC~~ SOAJ: 1.7000 mg | SUBCUTANEOUS | 7 refills | Status: AC

## 2022-09-25 NOTE — Telephone Encounter (Signed)
Sent medication to patient requested pharmacy  

## 2023-02-05 IMAGING — US US ABDOMEN LIMITED
1 series · 14 of 25 positions shown · non-contrast
Comparison: None Available.

CLINICAL DATA: Bulging right-side after eating.

EXAM:
ULTRASOUND ABDOMEN LIMITED RIGHT UPPER QUADRANT

[Series 1: us abdomen limited · 0.19mm/px · 14 of 40 slices shown]
[im 1/40]
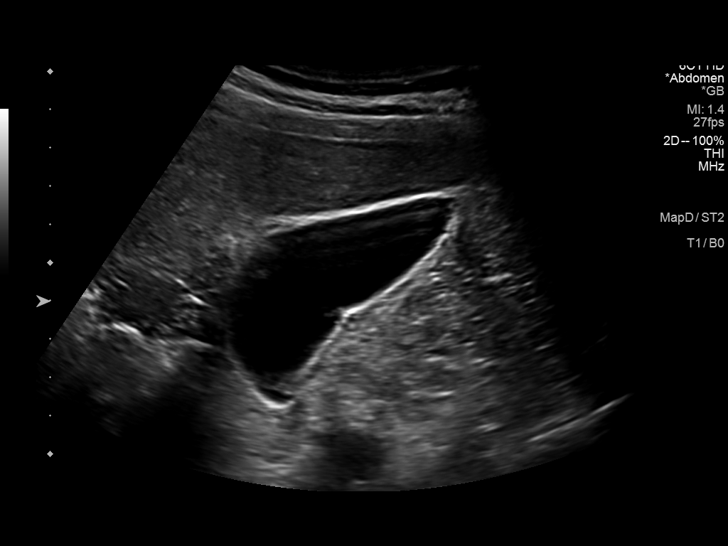
[im 4/40]
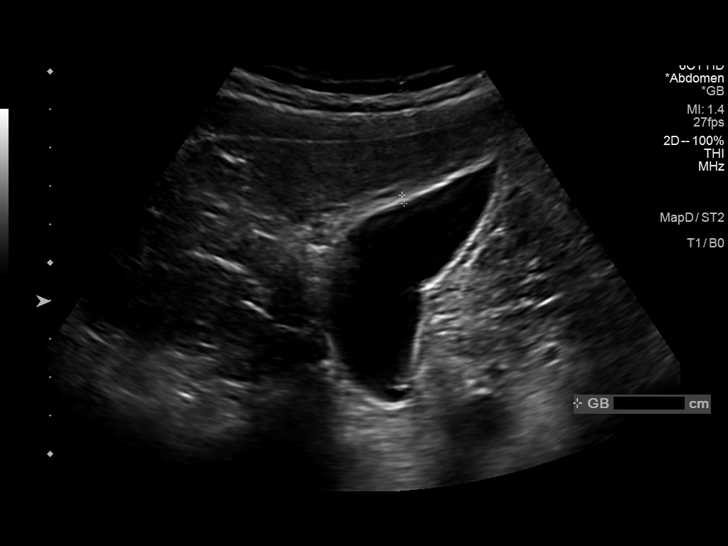
[im 7/40]
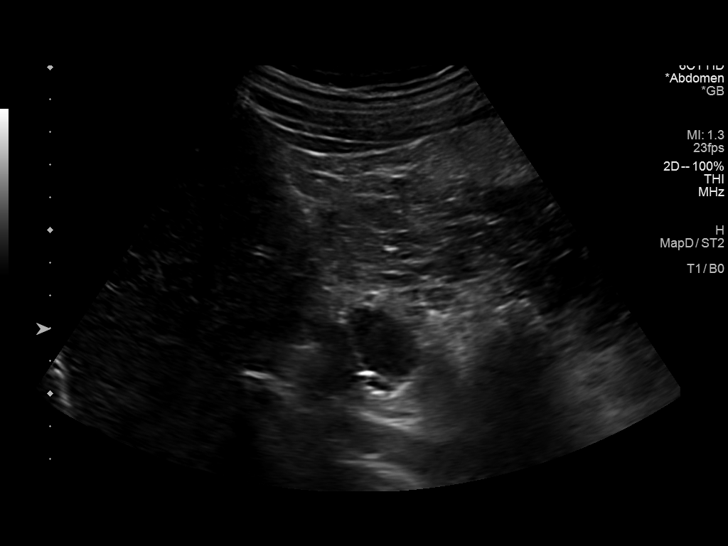
[im 10/40]
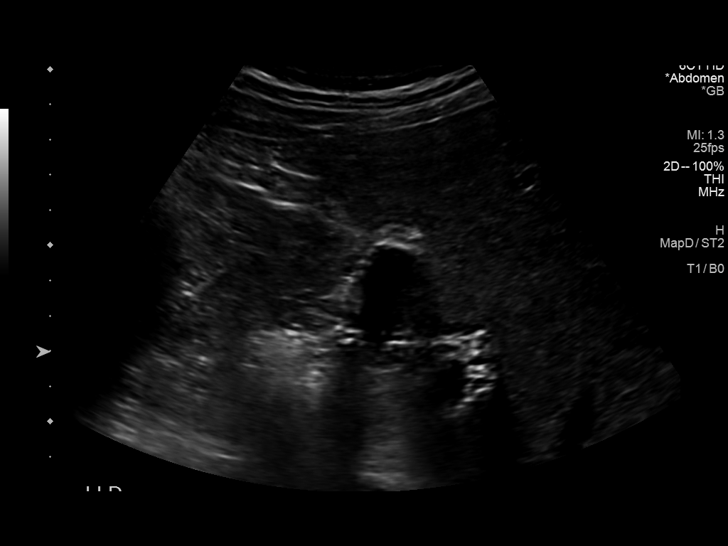
[im 14/40]
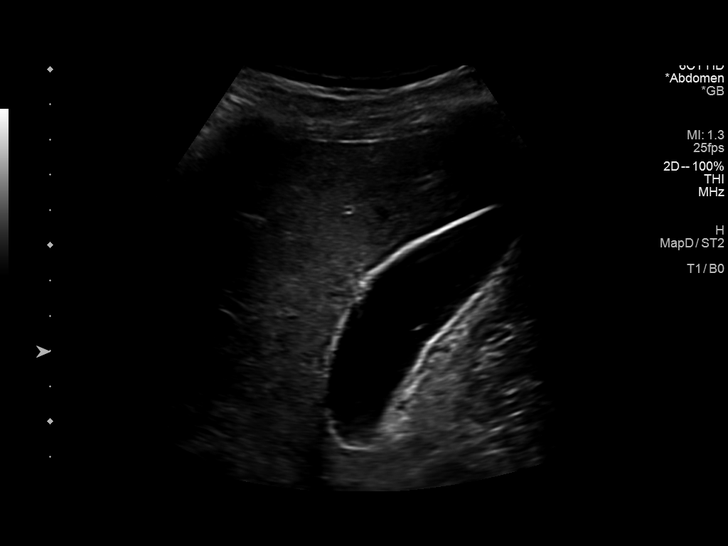
[im 15/40]
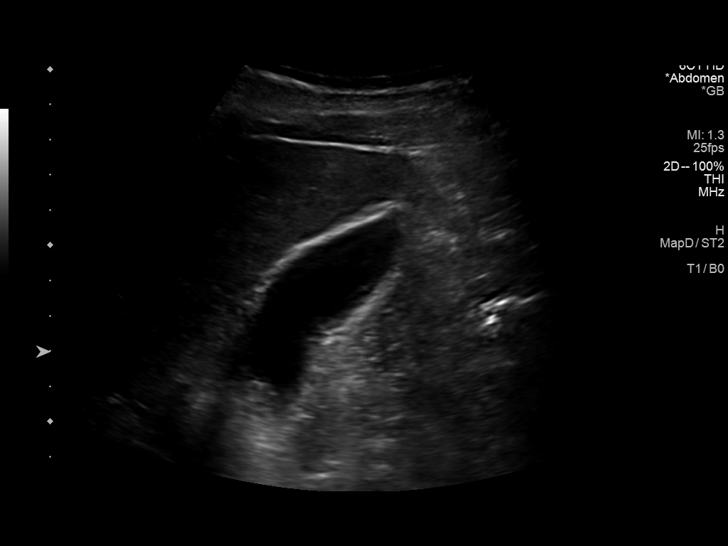
[im 18/40]
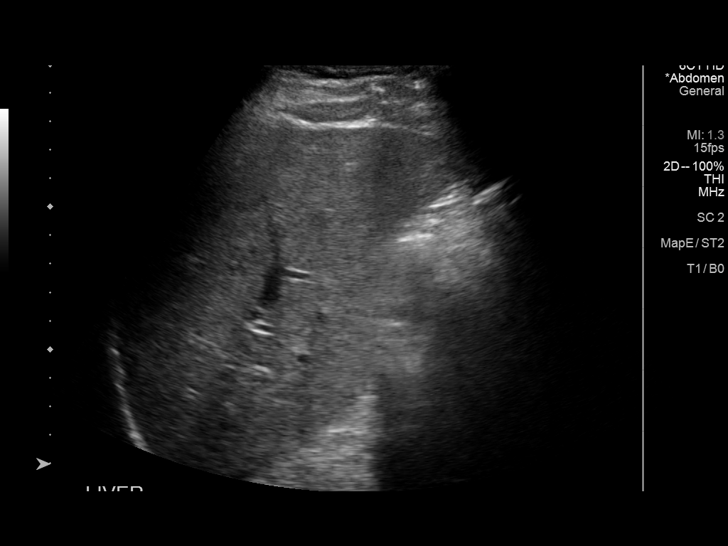
[im 22/40]
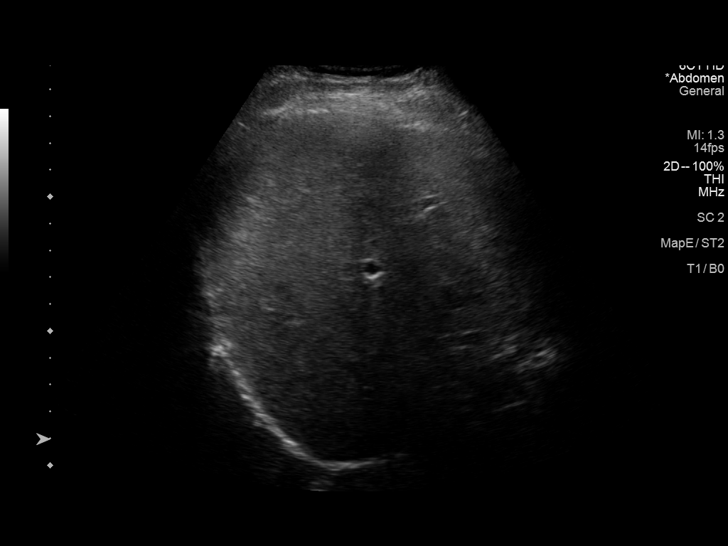
[im 25/40]
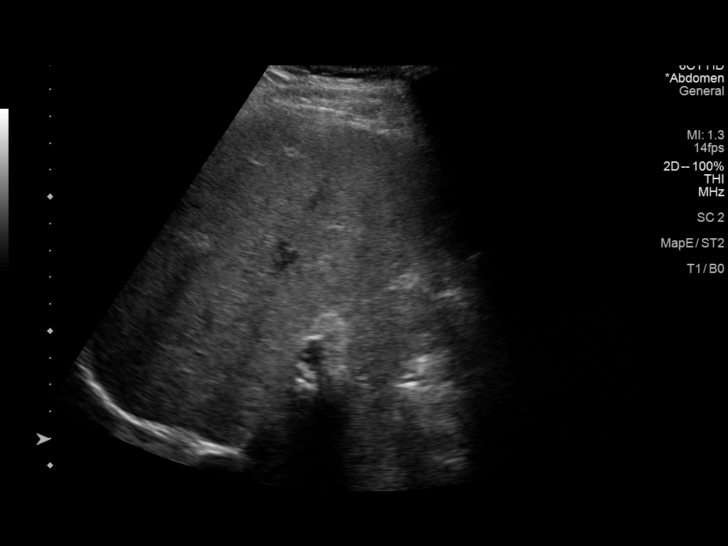
[im 27/40]
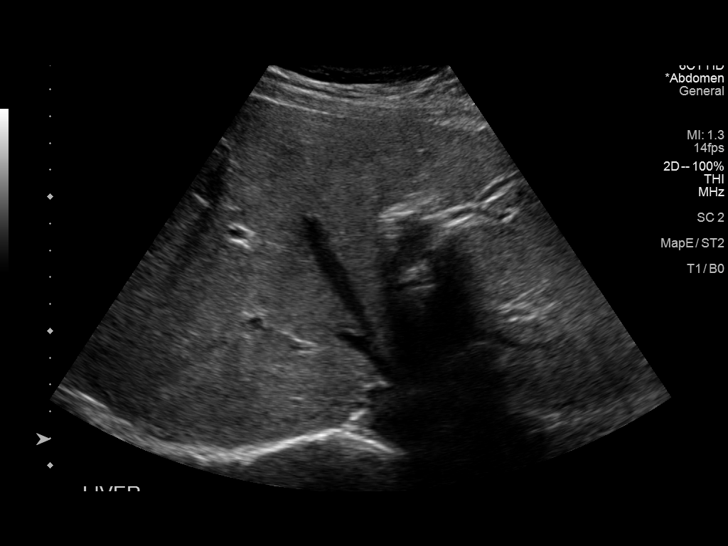
[im 30/40]
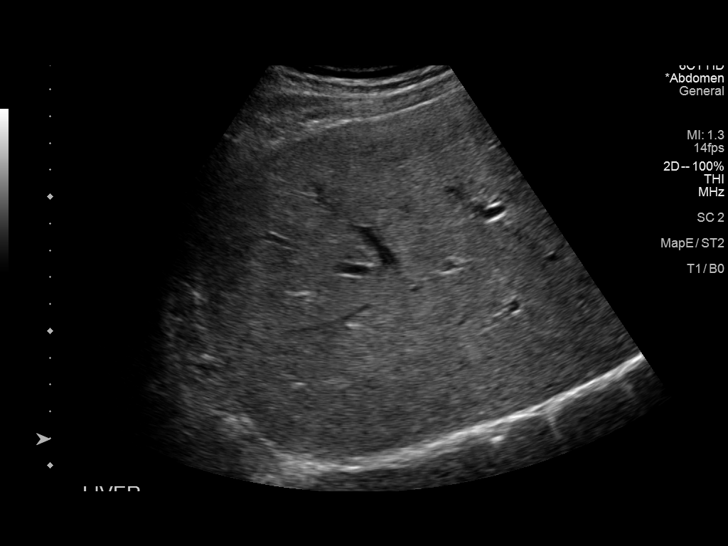
[im 33/40]
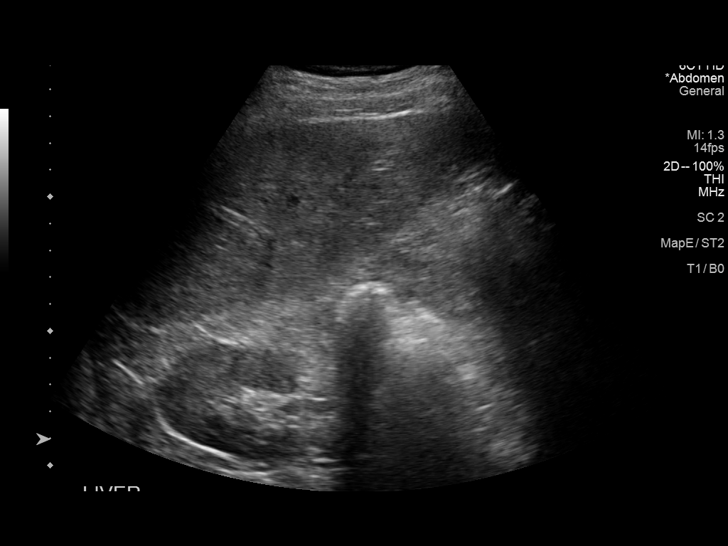
[im 36/40]
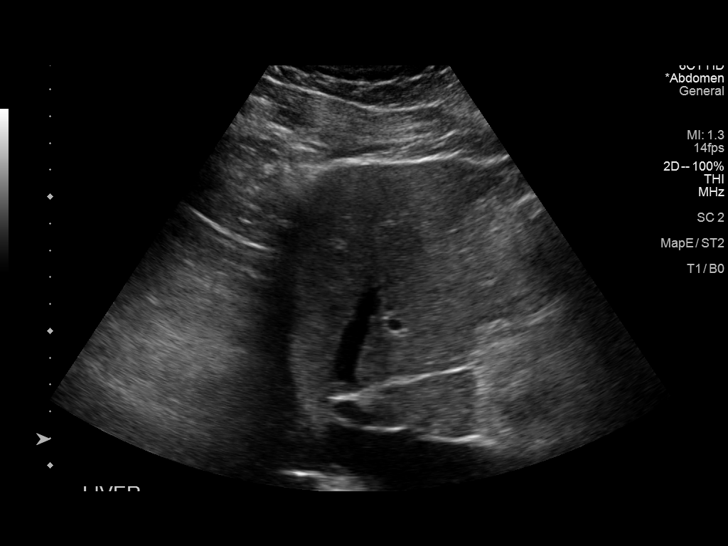
[im 40/40]
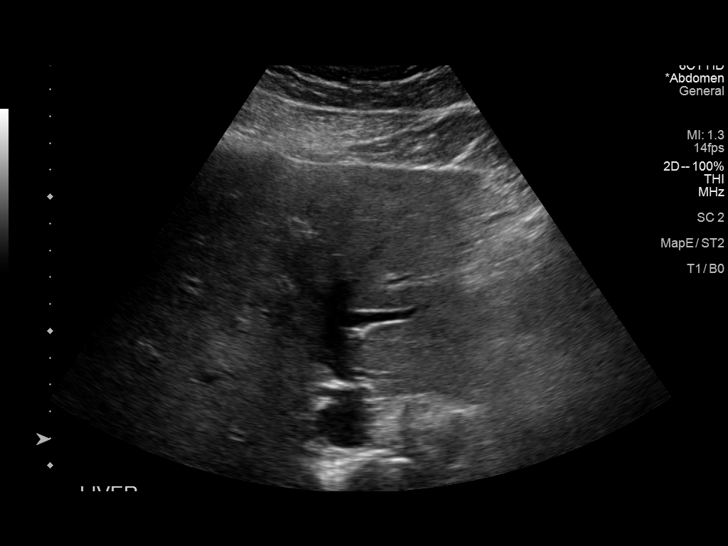

[14 of 25 positions shown; findings below may reference images not displayed]

FINDINGS: Gallbladder:

No gallstones or wall thickening visualized. No sonographic Murphy
sign noted by sonographer.

Common bile duct:

Diameter: 3.3 mm

Liver:

No mass identified. No significant abnormality. Portal vein is
patent on color Doppler imaging with normal direction of blood flow
towards the liver.

Other: None.
IMPRESSION: 1. The gallbladder and common bile duct are normal in appearance.
The liver is unremarkable.

## 2023-11-26 ENCOUNTER — Other Ambulatory Visit: Payer: Self-pay | Admitting: Emergency Medicine

## 2023-11-26 DIAGNOSIS — N5201 Erectile dysfunction due to arterial insufficiency: Secondary | ICD-10-CM
